# Patient Record
Sex: Female | Born: 1935 | Race: White | Hispanic: No | Marital: Married | Smoking: Never smoker
Health system: Southern US, Community
[De-identification: ages and names within clinical notes are randomized; demographics above are authoritative.]

## PROBLEM LIST (undated history)

## (undated) DIAGNOSIS — F29 Unspecified psychosis not due to a substance or known physiological condition: Secondary | ICD-10-CM

## (undated) DIAGNOSIS — E785 Hyperlipidemia, unspecified: Secondary | ICD-10-CM

## (undated) DIAGNOSIS — F329 Major depressive disorder, single episode, unspecified: Secondary | ICD-10-CM

## (undated) DIAGNOSIS — F419 Anxiety disorder, unspecified: Secondary | ICD-10-CM

## (undated) DIAGNOSIS — E559 Vitamin D deficiency, unspecified: Secondary | ICD-10-CM

## (undated) DIAGNOSIS — E538 Deficiency of other specified B group vitamins: Secondary | ICD-10-CM

## (undated) DIAGNOSIS — I2699 Other pulmonary embolism without acute cor pulmonale: Secondary | ICD-10-CM

## (undated) DIAGNOSIS — I10 Essential (primary) hypertension: Secondary | ICD-10-CM

## (undated) DIAGNOSIS — F0390 Unspecified dementia without behavioral disturbance: Secondary | ICD-10-CM

## (undated) DIAGNOSIS — F32A Depression, unspecified: Secondary | ICD-10-CM

## (undated) DIAGNOSIS — N390 Urinary tract infection, site not specified: Secondary | ICD-10-CM

## (undated) DIAGNOSIS — H409 Unspecified glaucoma: Secondary | ICD-10-CM

## (undated) DIAGNOSIS — H02103 Unspecified ectropion of right eye, unspecified eyelid: Secondary | ICD-10-CM

---

## 1998-11-01 ENCOUNTER — Other Ambulatory Visit: Admission: RE | Admit: 1998-11-01 | Discharge: 1998-11-01 | Payer: Self-pay | Admitting: *Deleted

## 2000-05-24 ENCOUNTER — Other Ambulatory Visit: Admission: RE | Admit: 2000-05-24 | Discharge: 2000-05-24 | Payer: Self-pay | Admitting: *Deleted

## 2001-04-30 ENCOUNTER — Encounter: Admission: RE | Admit: 2001-04-30 | Discharge: 2001-04-30 | Payer: Self-pay | Admitting: *Deleted

## 2001-05-08 ENCOUNTER — Encounter: Admission: RE | Admit: 2001-05-08 | Discharge: 2001-05-08 | Payer: Self-pay | Admitting: Internal Medicine

## 2001-05-08 ENCOUNTER — Encounter: Payer: Self-pay | Admitting: Internal Medicine

## 2001-08-11 ENCOUNTER — Encounter: Admission: RE | Admit: 2001-08-11 | Discharge: 2001-08-11 | Payer: Self-pay | Admitting: Internal Medicine

## 2001-08-11 ENCOUNTER — Encounter: Payer: Self-pay | Admitting: Internal Medicine

## 2002-06-02 ENCOUNTER — Encounter: Admission: RE | Admit: 2002-06-02 | Discharge: 2002-06-02 | Payer: Self-pay | Admitting: Obstetrics & Gynecology

## 2002-06-02 ENCOUNTER — Encounter: Payer: Self-pay | Admitting: Obstetrics & Gynecology

## 2002-08-26 ENCOUNTER — Emergency Department (HOSPITAL_COMMUNITY): Admission: EM | Admit: 2002-08-26 | Discharge: 2002-08-27 | Payer: Self-pay | Admitting: Emergency Medicine

## 2002-09-01 ENCOUNTER — Emergency Department (HOSPITAL_COMMUNITY): Admission: EM | Admit: 2002-09-01 | Discharge: 2002-09-01 | Payer: Self-pay | Admitting: Emergency Medicine

## 2002-09-07 ENCOUNTER — Emergency Department (HOSPITAL_COMMUNITY): Admission: EM | Admit: 2002-09-07 | Discharge: 2002-09-08 | Payer: Self-pay | Admitting: *Deleted

## 2002-09-13 ENCOUNTER — Inpatient Hospital Stay (HOSPITAL_COMMUNITY): Admission: AD | Admit: 2002-09-13 | Discharge: 2002-09-18 | Payer: Self-pay | Admitting: *Deleted

## 2002-10-16 ENCOUNTER — Inpatient Hospital Stay (HOSPITAL_COMMUNITY): Admission: EM | Admit: 2002-10-16 | Discharge: 2002-10-22 | Payer: Self-pay | Admitting: Psychiatry

## 2003-12-06 ENCOUNTER — Other Ambulatory Visit: Admission: RE | Admit: 2003-12-06 | Discharge: 2003-12-06 | Payer: Self-pay | Admitting: *Deleted

## 2006-05-09 ENCOUNTER — Ambulatory Visit (HOSPITAL_COMMUNITY): Payer: Self-pay | Admitting: *Deleted

## 2006-06-09 ENCOUNTER — Inpatient Hospital Stay (HOSPITAL_COMMUNITY): Admission: AD | Admit: 2006-06-09 | Discharge: 2006-06-14 | Payer: Self-pay | Admitting: Psychiatry

## 2006-06-10 ENCOUNTER — Ambulatory Visit: Payer: Self-pay | Admitting: Psychiatry

## 2006-07-04 ENCOUNTER — Ambulatory Visit (HOSPITAL_COMMUNITY): Payer: Self-pay | Admitting: *Deleted

## 2006-08-13 ENCOUNTER — Ambulatory Visit (HOSPITAL_COMMUNITY): Payer: Self-pay | Admitting: *Deleted

## 2006-11-12 ENCOUNTER — Ambulatory Visit (HOSPITAL_COMMUNITY): Payer: Self-pay | Admitting: *Deleted

## 2006-12-24 ENCOUNTER — Ambulatory Visit (HOSPITAL_COMMUNITY): Payer: Self-pay | Admitting: *Deleted

## 2006-12-29 ENCOUNTER — Ambulatory Visit: Payer: Self-pay | Admitting: *Deleted

## 2006-12-29 ENCOUNTER — Inpatient Hospital Stay (HOSPITAL_COMMUNITY): Admission: AD | Admit: 2006-12-29 | Discharge: 2007-01-05 | Payer: Self-pay | Admitting: *Deleted

## 2007-01-16 ENCOUNTER — Ambulatory Visit (HOSPITAL_COMMUNITY): Payer: Self-pay | Admitting: *Deleted

## 2007-01-20 ENCOUNTER — Inpatient Hospital Stay (HOSPITAL_COMMUNITY): Admission: AD | Admit: 2007-01-20 | Discharge: 2007-01-26 | Payer: Self-pay | Admitting: *Deleted

## 2007-02-04 ENCOUNTER — Ambulatory Visit (HOSPITAL_COMMUNITY): Payer: Self-pay | Admitting: *Deleted

## 2007-02-06 ENCOUNTER — Encounter: Admission: RE | Admit: 2007-02-06 | Discharge: 2007-02-06 | Payer: Self-pay | Admitting: Internal Medicine

## 2007-02-10 ENCOUNTER — Emergency Department (HOSPITAL_COMMUNITY): Admission: EM | Admit: 2007-02-10 | Discharge: 2007-02-10 | Payer: Self-pay | Admitting: Emergency Medicine

## 2007-02-12 ENCOUNTER — Encounter: Admission: RE | Admit: 2007-02-12 | Discharge: 2007-02-12 | Payer: Self-pay | Admitting: Internal Medicine

## 2007-02-13 ENCOUNTER — Ambulatory Visit: Payer: Self-pay | Admitting: Psychiatry

## 2007-02-13 ENCOUNTER — Inpatient Hospital Stay (HOSPITAL_COMMUNITY): Admission: RE | Admit: 2007-02-13 | Discharge: 2007-02-26 | Payer: Self-pay | Admitting: *Deleted

## 2007-02-26 ENCOUNTER — Ambulatory Visit (HOSPITAL_COMMUNITY): Admission: RE | Admit: 2007-02-26 | Discharge: 2007-02-26 | Payer: Self-pay | Admitting: Endocrinology

## 2007-03-04 ENCOUNTER — Inpatient Hospital Stay (HOSPITAL_COMMUNITY): Admission: RE | Admit: 2007-03-04 | Discharge: 2007-03-24 | Payer: Self-pay | Admitting: *Deleted

## 2007-03-04 ENCOUNTER — Ambulatory Visit: Payer: Self-pay | Admitting: *Deleted

## 2007-03-25 ENCOUNTER — Other Ambulatory Visit (HOSPITAL_COMMUNITY): Admission: RE | Admit: 2007-03-25 | Discharge: 2007-04-01 | Payer: Self-pay | Admitting: Psychiatry

## 2007-04-01 ENCOUNTER — Ambulatory Visit (HOSPITAL_COMMUNITY): Payer: Self-pay | Admitting: *Deleted

## 2007-04-10 ENCOUNTER — Ambulatory Visit (HOSPITAL_COMMUNITY): Payer: Self-pay | Admitting: *Deleted

## 2007-04-15 ENCOUNTER — Ambulatory Visit (HOSPITAL_COMMUNITY): Payer: Self-pay | Admitting: *Deleted

## 2007-04-22 ENCOUNTER — Ambulatory Visit (HOSPITAL_COMMUNITY): Payer: Self-pay | Admitting: *Deleted

## 2007-04-22 ENCOUNTER — Ambulatory Visit (HOSPITAL_COMMUNITY): Admission: RE | Admit: 2007-04-22 | Discharge: 2007-04-22 | Payer: Self-pay | Admitting: Internal Medicine

## 2007-05-15 ENCOUNTER — Encounter: Admission: RE | Admit: 2007-05-15 | Discharge: 2007-05-15 | Payer: Self-pay | Admitting: Internal Medicine

## 2007-05-17 ENCOUNTER — Ambulatory Visit: Payer: Self-pay | Admitting: *Deleted

## 2007-05-17 ENCOUNTER — Inpatient Hospital Stay (HOSPITAL_COMMUNITY): Admission: AD | Admit: 2007-05-17 | Discharge: 2007-06-02 | Payer: Self-pay | Admitting: *Deleted

## 2007-05-26 ENCOUNTER — Encounter: Payer: Self-pay | Admitting: *Deleted

## 2007-09-17 ENCOUNTER — Ambulatory Visit (HOSPITAL_COMMUNITY): Admission: RE | Admit: 2007-09-17 | Discharge: 2007-09-17 | Payer: Self-pay | Admitting: Urology

## 2008-01-26 ENCOUNTER — Ambulatory Visit (HOSPITAL_COMMUNITY): Admission: RE | Admit: 2008-01-26 | Discharge: 2008-01-26 | Payer: Self-pay | Admitting: Urology

## 2008-01-26 ENCOUNTER — Encounter (INDEPENDENT_AMBULATORY_CARE_PROVIDER_SITE_OTHER): Payer: Self-pay | Admitting: Interventional Radiology

## 2009-01-05 ENCOUNTER — Encounter: Admission: RE | Admit: 2009-01-05 | Discharge: 2009-01-05 | Payer: Self-pay | Admitting: Orthopedic Surgery

## 2009-06-22 ENCOUNTER — Encounter: Admission: RE | Admit: 2009-06-22 | Discharge: 2009-06-22 | Payer: Self-pay | Admitting: Internal Medicine

## 2011-04-17 NOTE — Discharge Summary (Signed)
[name], TOBACK NO.:  [id number]   MEDICAL RECORD NO.:  [id number]          PATIENT TYPE:  IPS   LOCATION:  0503                          FACILITY:  [facility]   PHYSICIAN:  Jasmine Pang, M.D. DATE OF BIRTH:  [birth year]   DATE OF ADMISSION:  05/17/2007  DATE OF DISCHARGE:  06/02/2007                               DISCHARGE SUMMARY   IDENTIFYING INFORMATION:  This is a voluntary admission for this 77-year-  old widowed white female who was admitted on May 17, 2007.  She was  most recently here from March 04, 2007 to March 24, 2007.   HISTORY OF PRESENT ILLNESS:  The patient has a history of depression,  and was referred for further inpatient treatment.  She reported feeling  severely depressed for the past week.  She reported daily crying spells,  feeling hopeless, helpless, anxious.  She wishes she would die.  She no  longer wants to live this way.  She is still grieving the death of her  husband who died in 2006/02/20.  Her medications were recently changed  and she feels they are not working.  She reports chronic headache and  nausea.  Again, her most recent stay with [place] was March 04, 2007 to March 24, 2007.  She has had a number of hospitalizations on our unit.  She  had just begun to see a new doctor who was going to take over her  psychiatric medications and medical treatment, Dr. Erick Blinks. She  states there is some history of depression in her family.  She has  glaucoma, gastroesophageal reflux disease and hyperlipidemia.  She is  currently on Prestiq 50 mg p.o. daily, Protonix 40 mg p.o. daily,  Lipitor 20 mg at bedtime, Seroquel 100 mg at h.s., Ativan  0.5 mg  q.a.m., Ativan 1 mg to 2 mg at h.s., Timoptic 1 drop q. day, and  Travatan 1 drop q. day.  She has no known drug allergies.   PHYSICAL FINDINGS:  The patient had no remarkable physical findings.  She walks unaided.  She appears to be her stated age of 77.   ADMISSION LABORATORIES:  Urine  drug screen was negative.  Urinalysis was  within normal limits.  Hemogram was within normal limits.  Basic  metabolic panel was grossly within normal limits except for an elevated  glucose of 193 (70-99).  A hepatic profile was within normal limits, and  TSH was 3.534, which was within normal limits.   HOSPITAL COURSE:  Upon admission, the patient was started on Timoptic  eye drops 1 drop each eye daily, Travatan eye drops 1 drop each eye  daily, Prestiq 50 mg p.o. q. day, Protonix 40 mg daily, Lipitor 20 mg  p.o. nightly, Seroquel 100 mg p.o. nightly, Ativan 0.5 mg 1 p.o. q. a.m.  and 1 mg p.o. nightly.  She was also started on Ativan 0.5 mg p.o.  t.i.d. p.r.n. anxiety.  On May 18, 2007, due to complaints of GERD, she  was started on Carafate 1 gram per 10 mL water in the a.m. before  breakfast and at h.s.  She was also started on Phenergan 25 mg p.o. now  secondary to nausea.  On May 19, 2007, Seroquel was discontinued.  She  was started on Zoloft 12.5 mg p.o. daily, Remeron 7.5 mg nightly.  Protonix was discontinued.  On May 20, 2007, Seroquel was restarted at  100 mg p.o. nightly because she was unable to get to sleep.  On May 20, 2007, she was started on Zofran 4 mg p.o. now x1 dose.  On May 22, 2007, her Zoloft was increased to 25 mg daily.  On May 23, 2007, due to  a.m. sedation, her Seroquel was discontinued at h.s.  Instead, she was  begun on Seroquel 50 mg p.o. nightly, may repeat x1 p.r.n. insomnia.  She was also started on methylphenidate 2.5 mg p.o. q.a.m. and noon.  On  May 26, 2007, she was started on Zantac 150 mg now, then 150 mg  p.o.  q. day.  On May 26, 2007, due to difficulty sleeping, the patient's  Seroquel was increased to 100 mg p.o. nightly.  Remeron was increased to  15 mg p.o. nightly.  On May 28, 2007, Zoloft was increased to 50 mg  p.o. nightly.  The patient tolerated her medications well.  She  continued to complain of some nausea but overall was  able to take these  medications.   Initially, the patient was feeling depressed and anxious.  She was  complaining of nausea.  Her sleep was good.  Appetite, some decrease.  She has some passive suicidal ideation, I'm tired of being sick.  We  discussed assisted living since she has not done well when she has been  discharged back to her home.  She was tearful about this and somewhat  resistant, but agreed to pursue this if this is what we and her family  felt was best.  I talked with her new doctor, Erick Blinks, MD, who  just took over her care.  She made recommendations for medication  management.  This included beginning the Zoloft as above and Remeron as  above.  She recommended using an H2-blocker instead of a proton pump  inhibitor, so Protonix was discontinued and Zantac was started.  She  continued to feel depressed and anxious during the hospital stay.  She  complained of low energy.  She had begun to look at assisted-living  places, and still remained anxious about this.  She was appropriate on  the unit and able to participate in the unit therapeutic groups and  activities and therapies.  Her son was actively involved in helping find  an assisted-living for her.  He found [facility], and after  visiting it he felt this would be a good placement for her.  As the  patient's hospitalization progressed, she became less depressed, more  positive mood and affect.  She had no suicidal or homicidal ideation.  No auditory or visual hallucinations.  She continued to be anxious about  going to [facility].  She had days periodically where she felt  more nauseated and depressed, but overall she was less depressed, and  her sleep and appetite were good.  She remained somewhat tearful about  the transition to assisted living instead of going back to her own home.  The patient continued to progress and do well.  She was placed on the medications as indicated in the above  paragraph.  On June 02, 2007, the  patient's mental status had improved markedly from admission status.  She was friendly and cooperative with good eye contact.  Speech was  normal rate and flow.  Psychomotor activity was within normal limits.  Her mood was somewhat anxious but overall euthymic.  Affect wide range.  No suicidal or homicidal ideation.  No thoughts of self-injurious  behavior.  No auditory or visual hallucinations.  No paranoia or  delusions.  Thoughts were logical and goal-directed.  Thought content.  No predominant theme.  Cognitive was grossly back to baseline which was  within normal limits.  She was felt safe to be transitioned to assisted-  living [facility]) today.  Her son was going to pick her up and  take her.   DISCHARGE DIAGNOSES:  AXIS I:  Major depressive disorder, recurrent, severe without psychosis.  Anxiety disorder not otherwise specified.  (Some symptoms of panic  disorder).  AXIS II:  None.  AXIS III:  Gastroesophageal reflux disease, osteoporosis,  hyperlipidemia, glaucoma.  AXIS IV:  Moderate (grief, problems with primary support group, burden  of psychiatric illness).  AXIS V: Global assessment of functioning on discharge was 50.  Global  assessment of functioning upon admission was 35.  Global assessment of  functioning highest the past year was 65.   DISCHARGE PLANS:  There were no specific activity level or dietary  restrictions.   POST HOSPITAL CARE PLANS:  The patient will see Erick Blinks, MD for  followup medication management.  This appointment is being arranged by  our case manager.   DISCHARGE MEDICATIONS:  1. Travatan 1 drop in each eye daily.  2. Lipitor 20 mg daily.  3. Ativan 1 mg 1/2 pill in the morning and 1 pill at bedtime.  4. Prestiq 50 mg daily.  5. Timoptic eye drops 1 drop in each eye daily.  6. Carafate 1 gram at 10:00 a.m. and 8:00 p.m.  7. Methylphenidate 2.5 mg p.o. q.a.m. and noon.  8. Zantac 150 mg in  the a.m.  9. Seroquel 100 mg at bedtime.  10.Remeron 15 mg at bedtime.  11.Zoloft 50 mg in the a.m.      Jasmine Pang, M.D.  Electronically Signed     BHS/MEDQ  D:  06/02/2007  T:  06/02/2007  Job:  [id number]

## 2011-04-17 NOTE — H&P (Signed)
[name], Pennington NO.:  [id number]   MEDICAL RECORD NO.:  [id number]          PATIENT TYPE:  IPS   LOCATION:  0503                          FACILITY:  [facility]   PHYSICIAN:  Jasmine Pang, M.D. DATE OF BIRTH:  12-Dec-1926   DATE OF ADMISSION:  05/17/2007  DATE OF DISCHARGE:                       PSYCHIATRIC ADMISSION ASSESSMENT   IDENTIFYING INFORMATION:  This is a voluntary admission to the services  of Dr. Milford Cage.  This is a 77 year old widowed white female.  She  was most recently here March 04, 2007 to March 24, 2007.   The patient has a history for depression and was referred for further  inpatient treatment by her psychiatrist, Dr. Milford Cage.  She reports  feeling severely depressed for the past week.  She reports daily crying  spells, feeling hopeless, helpless, anxious, she wishes she would die  and no longer wants to live this way.  She is still grieving the death  of her husband who died in 02-23-06.  Her medications were recently  changed and she feels that they are not working.  She reports chronic  headache and nausea.  Again, her most recent stay with [place] was March 04, 2007 to March 24, 2007.  She has had several visits with [place].   SOCIAL HISTORY:  She lives alone.  She has two supportive children but  she does live alone and she lost her husband after 2 years of marriage.   FAMILY HISTORY:  She denies.   ALCOHOL/DRUG HISTORY:  She denies.   PRIMARY CARE PHYSICIAN:  Her primary care Laquon Emel is Dr. Lendell Caprice and  Dr. Selena Batten at [phone].   MEDICAL PROBLEMS:  Glaucoma, gastroesophageal reflux disease,  hyperlipidemia.   MEDICATIONS:  She is currently prescribed Pristiq 50 mg p.o. q.d.,  Protonix 40 mg p.o. q.d., Lipitor 20 mg at h.s., Seroquel 100 mg at  h.s., Ativan 0.5 mg q.a.m., Ativan 1-2 mg at h.s., Timoptic 1 drop q.d.  and Travatan 1 drop q.d.   ALLERGIES:  No known drug allergies.   POSITIVE PHYSICAL FINDINGS:  Her  admitting labs are pending.  She has no  remarkable physical findings.  She appears to be her stated age of 36.  She walks unaided.  Her vital signs on admission show she is 5 feet 2  inches, weighs 142 pounds, temperature is 97.7, blood pressure 137/75 to  138/76, pulse 80-83, respirations are 20.   MENTAL STATUS EXAM:  Today, she is alert and oriented x3.  She appears  to be appropriately groomed, dressed and nourished.  She is walking  unaided.  Her motor is a little slow but this is her usual presentation.  Her speech is not pressured.  Her mood is depressed and anxious but not  to the degree that she has been in the past.  Thought processes are  fair.  She is not frankly delusional or paranoid.  Her judgment and  insight are fair.  Concentration and memory are fair.  Intelligence is  at least average.  She denies being actively suicidal or homicidal.  She  denies auditory or visual hallucinations.  DIAGNOSES:  AXIS I:  Major depressive disorder, recurrent, severe.  Anxiety disorder not otherwise specified.  AXIS II:  Deferred.  AXIS III:  Gastroesophageal reflux disease, osteoporosis,  hyperlipidemia.  AXIS IV:  Moderate (grief, problems with primary support group).  AXIS V:  35; in the past her highest GAF has been 65.   PLAN:  To admit for safety and stabilization.  Dr. Katrinka Blazing apparently has  a plan to adjust her medications.  The patient is requesting something  so that she does not wake up with a headache.  Ativan 0.5 mg t.i.d.  p.r.n. has already been ordered and she also complains about nausea and  to address that we can add some Carafate.  We will add some Carafate at  night and also in the morning.  She stated that she was supposed to be  getting a phone call in the morning to schedule and endoscopy.  The  other thing is that we might ought to consider placement in assisted-  living or some place where she would have more social support.      Mickie Leonarda Salon,  P.A.-C.      Jasmine Pang, M.D.  Electronically Signed    MD/MEDQ  D:  05/18/2007  T:  05/18/2007  Job:  [id number]

## 2011-04-20 NOTE — Discharge Summary (Signed)
[name], [name] NO.:  [id number]   MEDICAL RECORD NO.:  [id number]          PATIENT TYPE:  IPS   LOCATION:  0301                          FACILITY:  [facility]   PHYSICIAN:  Jasmine Pang, M.D. DATE OF BIRTH:  1927-03-30   DATE OF ADMISSION:  03/04/2007  DATE OF DISCHARGE:  03/24/2007                               DISCHARGE SUMMARY   IDENTIFICATION:  [age]-year-old Caucasian female who was  admitted on a voluntary basis.   HISTORY OF PRESENT ILLNESS:  The patient presents to [facility] complaining of nausea and pressure headache when she  wakes up.  She states that it started 2 weeks ago after being discharged  from here.  The symptoms last all morning almost every day.  She states  that she wonders if it may be from her medications.  She says she is  tired of being tired.  Husband died 1 year ago on 02-21-06.  She  denied auditory or visual hallucinations, or suicidal ideation, but does  make statements about wanting to be with her husband because she feels  so bad.  The patient was seen here 2 weeks ago for major depressive  disorder.  She is an outpatient of Dr. Milford Cage.  She has been here  for a history for depression.  Every Monday she sees Jamelle Haring,  psychologist, for therapy.  Her daughter has a history of depression.  The patient has no alcohol or drug abuse history.  The patient has  hypercholesterolemia.  She is on numerous medications (see history of  present illness).  She has no known drug allergies.   PHYSICAL FINDINGS:  The patient had no acute physical on medical  problems.  She was in no distress.   ADMISSION LABORATORIES:  Urinalysis was negative. Urinalysis was within  normal limits except for 7 to 10 WBCs and a few bacteria.  Comprehensive  metabolic panel was grossly within normal limits except for an elevated  glucose of 124 (70 to 99) an elevated ALT of 40 (0 to 35), and an  elevated  bilirubin of 1.3 (0.3-1.2).  CBC was grossly within normal  limits.  TSH was within normal limits at 3.740.   HOSPITAL COURSE:  Upon admission, patient was continued on her home  medications of Seroquel 100 mg p.o. nightly, Lexapro 10 mg p.o. q. day,  Klonopin 0.5 mg p.o. t.i.d., Protonix 40 mg b.i.d., Zocor 40 mg daily,  Timoptic eye drops both eyes at h.s., Xalatan eye drops 1 drop daily  both eyes, calcium tablets 500 mg p.o. b.i.d., Fosamax Plus D 70 mg p.o.  q.7 days on Wednesday.  On March 04, 2007, patient was given Ativan 1 mg  p.o. q.6 hours p.r.n. anxiety, and trazodone 550 mg p.o. nightly p.r.n.  insomnia..  On March 07, 2007, patient's Lexapro was increased to 20 mg  p.o. q. day.  Lamictal 25 mg p.o. nightly was started.  On March 12, 2007  due to daytime sedation, Seroquel was decreased to 50 mg p.o. nightly.  On March 14, 2007,  Lamictal was increased to 50 mg p.o.  q. day,  Seroquel was increased back to 100 mg p.o. nightly due to insomnia.  On  March 21, 2007, Protonix was changed from b.i.d. to 40 mg p.o. q.a.m.,  Lamictal was increased to 75 mg p.o. q. day.  The patient tolerated her  medications well with no significant side effects except for some  sedation.   During the first week here, the patient was very depressed and anxious.  She would repeatedly state I want to be happy.  She stated she wished  she could be with her husband who was deceased.  She was very tearful.  She had difficulty sleeping.  Patient talked a lot about her deceased  husband and how much she missed him.  Lexapro was increased to 20 mg  q.a.m., clonazepam 0.5 mg p.o. t.i.d. was started, Lamictal 25 mg p.o.  nightly, and then Seroquel 500 mg p.o. nightly was started.  On March 13, 2007, the patient stated she felt somewhat better.  She was still  depressed and anxious with constricted affect.  She continued to state  I want to feel good, and I feel like I'm not going to be okay again.  Lamictal  was increased to 50 mg p.o. daily on March 14, 2007.  Seroquel  was increased back to 100 mg p.o. nightly because of insomnia when it  was decreased to 50 mg p.o. nightly.  The patient was also getting  lorazepam 1 mg p.o. q.h.s. and was doing well on this in addition to the  Seroquel.  On March 15, 2007, patient's mood continued to be depressed  and anxious and hopeless.  She stated I want to be with Michelle Piper (her  deceased husband).  She did admit that her children are a deterrent to  suicide.  The second week in the hospital patient continued to be  depressed and anxious, though somewhat less so than upon admission.  Her  family had found a caretaker who was going to help her by staying with  her every day in the morning.  We were also trying to get her into the  intensive outpatient program for therapy for continued intensive  therapy.  In addition, we were looking at [facility]  for help when she is at home in the afternoons.   On March 21, 2007, patient had gone outside and felt less depressed and  anxious.  She enjoyed the pretty weather.  She was anxious about her  upcoming discharge, but her stomach had been feeling good.  The Lamictal  was increased to 75 mg p.o. daily.  On March 22, 2007, sleep was good,  appetite was good.  Mood was improved.  She was less depressed and  anxious.  There was no suicidal or homicidal ideation.  No auditory or  visual hallucinations.  The patient continued to improve.  She reported  she liked the combination of the lorazepam at bedtime in addition to the  Seroquel.  She felt she got a good night's sleep with this combination.  I will continue this despite the fact that she is on clonazepam during  the daytime.  The patient's, on March 24, 2007, mental status had  improved markedly from admission status.  The patient was friendly,  cooperative, conversant.  She had good eye contact.  Speech was normal rate and flow.  Psychomotor  activity was within normal limits.  Mood was  somewhat anxious about going home but overall euthymic.  Affect wide  range.  There  was no suicidal or homicidal ideation.  No thoughts of  self-injurious behavior.  No auditory or visual hallucinations.  No  paranoia or delusions.  Thoughts were logical and goal-directed.  Thought content anxious about returning home and hoping she will be able  to keep from getting so anxious again.   DISCHARGE DIAGNOSES:  AXIS I:  Major depression, major depressive  disorder, recurrent, severe without psychosis.  Anxiety disorder not  otherwise specified.  AXIS II:  None.  AXIS III:  Hyperlipidemia, osteoporosis, gastroesophageal reflux  disease.  AXIS IV:  Moderate (grief over loss of husband who died 1 year ago).  Issues related to her psychiatric problems.  AXIS IV: Global assessment of functioning upon discharge was 50.  Global  assessment of functioning upon admission 35.  Global assessment of  functioning highest past year was 65.   DISCHARGE PLANS:  There were no specific activity level or dietary  restrictions.   DISCHARGE MEDICATIONS:  1. Clonazepam 0.5 mg p.o. t.i.d.  2. Zocor 40 mg p.o. q 6 p.m.  3. Timoptic 0.5% ophthalmic solution, 1 drop each eye at bedtime  4. Calcium carbonate 500 mg p.o. b.i.d.  5. Fosamax Plus D once weekly on Wednesday.  6. Xalatan ophthalmic solution 1 drop in both eyes daily.  7. Lexapro 20 mg daily.  8. Seroquel 100 mg at bedtime.  9. Protonix 40 mg each a.m. with food.  10.Lamictal 25 mg 3 pills at bedtime.  11.Lorazepam 1 mg at bedtime.      Jasmine Pang, M.D.  Electronically Signed     BHS/MEDQ  D:  03/24/2007  T:  03/24/2007  Job:  [id number]

## 2011-04-20 NOTE — H&P (Signed)
NAMEWESTON, FULCO NO.:  [id number]   MEDICAL RECORD NO.:  [id number]          PATIENT TYPE:  IPS   LOCATION:  0507                          FACILITY:  [facility]   PHYSICIAN:  Jasmine Pang, M.D. DATE OF BIRTH:  03-21-1927   DATE OF ADMISSION:  01/20/2007  DATE OF DISCHARGE:                       PSYCHIATRIC ADMISSION ASSESSMENT   A 77 year old widowed white female voluntarily admitted on January 20, 2007.  The patient presents with a history of depression, suicidal  thoughts, with a plan to overdose.  The patient is grieving over her  husband's death; it is almost a 1-year anniversary after a marriage of  53 years.  The patient was recently discharged from our facility for  similar symptoms.  She is having problems with nausea that she  attributes to her bedtime medication and she states that is currently  her biggest problem.   PAST PSYCHIATRIC HISTORY:  Third admission to the [facility], was recently discharged on January 05, 2007.  Sees Dr. Milford Cage for outpatient mental health services.   SOCIAL HISTORY:  A 77 year old widowed white female.  Her husband died  approximately a year ago after being married for 53 years.  She has two  supportive children.  The patient lives alone.   FAMILY HISTORY:  Denies.   ALCOHOL/DRUG HISTORY:  Nonsmoker.  Denies any alcohol or drug use.  Primary care Daylyn Azbill is Dr. Lendell Caprice at phone number [phone].   MEDICAL PROBLEMS:  Glaucoma.   MEDICATIONS:  1. Ativan 0.5-1 mg p.o. daily and Ativan 1-2 mg p.r.n. at bedtime.  2. Lipitor 20 mg at bedtime.  3. Takes a woman's multivitamin with calcium.  4. Celexa 10 mg b.i.d.  5. Seroquel 100 mg at bedtime.  6. Calcium with vitamin D 100 mg two p.o. daily.  7. Timolol eye drops 0.5% one drop each eye daily.  8. Xalatan 0.005% one drop daily each eye.  9. Fosamax + D once a week on Wednesday.   DRUG ALLERGIES:  No known allergies.   REVIEW OF SYSTEMS:   The patient denies any fever or chills.  No chest  pain.  No shortness breath.  Positive for nausea.  No vomiting.  No  diarrhea.  No falls.  No seizures.  No muscle aches.  Positive for  depression.  No substance abuse.   PHYSICAL EXAMINATION:  VITAL SIGNS:  Temperature is 97.9.  Heart rate  82.  Respirations 60.  Blood pressure 145/82.  Height 5 feet 1 inches  tall.  Weight 138 pounds.  GENERAL APPEARANCE:  An alert, elderly female in no acute distress.  She  appears well-nourished.  HEART:  Regular rate and rhythm.  CHEST EXAM:  Chest is clear, no wheezing.  ABDOMEN:  Soft, nontender.  EXTREMITIES:  Moves all extremities.  No clubbing.  No edema.  SKIN:  Warm and dry without rashes or lacerations.  NEUROLOGIC:  Findings are intact, nonfocal.  No tremors.  No tics.   LABORATORY DATA:  No current lab work available.  The patient again was  recently discharged approximately 1 week ago.   MENTAL STATUS EXAM:  She  is fully alert, cooperative, good eye contact.  She is casually dressed.  Her speech is clear, normal pace and tone.  The patient's mood is depressed.  The patient is sad, tearful, but  pleasant.  Thought processes are coherent; there is no evidence of  psychosis, cognitive function intact.  Memory is good.  Judgment is  good.  Insight is good.   Axis I:  Major depressive disorder recurrent, severe.  Axis II:  Deferred.  Axis III:  Glaucoma.  Axis IV:  Other psychosocial problems related to husband's death.  Axis V:  Current is 35.   PLAN:  Voluntary admission for depression and suicidal thoughts.  Contract for safety.  Stabilize her mood and thinking.  We will resume  her medications.  We will discontinue Seroquel at this time and give a  trial run of Risperdal for ruminating and for sleep.  We will contact  her primary care Brinley Treanor for recommendations in regards to the  patient's nausea.  The patient is to increase coping skills.  Continue  to attend groups as she  has.  Her tentative length of stay is 5-7 days.      Landry Corporal, N.P.      Jasmine Pang, M.D.  Electronically Signed    JO/MEDQ  D:  01/24/2007  T:  01/25/2007  Job:  [id number]

## 2011-04-20 NOTE — Discharge Summary (Signed)
NAMEZINIA, INNOCENT NO.:  [id number]   MEDICAL RECORD NO.:  [id number]          PATIENT TYPE:  IPS   LOCATION:  0506                          FACILITY:  [facility]   PHYSICIAN:  Jasmine Pang, M.D. DATE OF BIRTH:  1926-12-23   DATE OF ADMISSION:  06/09/2006  DATE OF DISCHARGE:  06/14/2006                                 DISCHARGE SUMMARY   IDENTIFYING INFORMATION:  The patient is a 77 year old widowed Caucasian  female who was admitted on a voluntary basis to my service on June 09, 2006.  I also see her outpatient in the outpatient clinic.   HISTORY OF PRESENT ILLNESS:  The patient has had a history of depression.  Recently has begun to have auditory hallucinations.  She lost her husband of  56 years three months ago.  She lost her sister as well and brother-in-law  this year.  She heard her husband's voice at night.  She was doing well  until she developed a UTI and back pain.  She has passive suicidal ideation.  She continued to feel that she may rather be with her husband.   PAST PSYCHIATRIC HISTORY:  This is the second [facility] admission.  She was here  in 2003.  As indicated above, she sees me in the outpatient clinic for  medication management.  She does not have a therapist at this time.   FAMILY HISTORY:  The patient denies.   SUBSTANCE ABUSE HISTORY:  Nonsmoker.  Does not use alcohol or drugs.   PAST MEDICAL HISTORY:  Medical problems include osteoporosis, dyslipidemia  and history of ovarian cancer in 1999.   MEDICATIONS:  Calcium 600 mg p.o. t.i.d., Women's One-A-Day pill p.o. daily,  timolol GFS 1 drop in each eye daily, Xalatan 0.005% 1 drop in each eye  every a.m., Seroquel 50 mg p.o. q.h.s., lorazepam 0.25 mg to 0.5 mg daily  q.h.s., Celexa 10 mg q.d., Lipitor 20 mg q.h.s., Phenergan 6.25 mg to 12.5  mg p.o. p.r.n. nausea, Fosamax D 4 tablets a.m. every week.   ALLERGIES:  No known drug allergies.   PHYSICAL EXAMINATION:  A complete physical  examination was done by our nurse  practitioner.  She was found to be healthy and in no acute distress.  There  were no significant physical abnormalities at this time.   LABORATORY DATA:  On admission, CBC was within normal limits.  WBC 7.1,  hemoglobin 13.8, hematocrit 40.7.  Routine chemistry panel was within normal  limits except for an elevated glucose of 117 (70-99).  Sodium was 140,  potassium 3.7, chloride 106, CO2 30, BUN 19, creatinine 1, calcium 9.7,  albumin 4.1, total protein 7.3.  Hepatic panel was all within normal limits.  AST 17, ALT 17, ALP 65, total bilirubin 1.1.  Magnesium is 2.2.  Urine drug  screen was negative.  Urinalysis was within normal limits.  TSH was 2.699,  which was within normal limits.   HOSPITAL COURSE:  Upon admission, the patient was kept on her One-A-Day  vitamin 1 p.o. daily, timolol GFS 1 drop in each eye h.s. daily, Xalatan  0.005% 1 drop in  each eye in the a.m., Lipitor 20 mg p.o. q.h.s., Phenergan  6.25 mg to 12.5 mg p.o. p.r.n.  She was also placed on Ativan 0.5 mg, 1/2-1  tab p.o. b.i.d. p.r.n. anxiety and Ativan as a standing order 0.5 mg p.o.  q.h.s.  She was also continued on her calcium vitamin D 500 mg, 1 tab b.i.d.  She was placed on Seroquel 50 mg p.o. q.h.s., may repeat dose if needed for  sleep.  She was placed on Fosamax D 4 tablets daily in the a.m. once a week.  On June 09, 2006, the patient was placed on Celexa 10 mg p.o. b.i.d.  Also  her Lipitor 20 mg p.o. was scheduled at 1800.  On June 09, 2006, the patient  required an Ativan 0.5 mg p.o. now.  On June 10, 2006, the patient was begun  on Ativan 0.25 mg to 0.5 mg b.i.d. p.r.n.  The other Ativan order was  discontinued.  The Ativan 0.5 mg p.o. q.h.s. was continued.  On June 10, 2006, the Ativan was increased to 1 mg p.o. q.h.s.  On June 10, 2006, she was  also placed on Zyprexa 2.5 mg p.o. q.h.s.  The patient tolerated her  medications well with no significant side effects.   When  I first met with the patient, she discussed her anxiety and depression.  She stated they had worsened significantly.  She is decompensated.  She had  begun to experience auditory hallucinations and had passive suicidal  ideation.  On June 11, 2006, she discussed her multiple losses in the past  year.  Husband had died, two sisters and a brother-in-law passed away.  She  described her husband's illness and how she tried to be strong.  She took  care of him and got him to all the appointments necessary and was able to  give him his medications and keep him at home.  After he died, she states  there was much support from her children while she was grieving.  On June 12, 2006, the patient stated there was less pressure in her head.  She felt  more rested on the Zyprexa, was very sedate.  The nausea was resolving.  The  auditory hallucinations had resolved.  She was still tearful when talking  about her husband.   On June 12, 2006, our counselor met with the patient, the patient's son and  daughter-in-law.  The patient stated that she is feeling much better today,  that she is not having any pain.  She denied suicidal or homicidal ideation.  She stated that she is still grieving the death of her husband.  The  patient's son and daughter-in-law encouraged her to join a support group  through hospice.  The patient's son stated that the patient has a strong  support system of family and friends.  The patient stated that she had a  wonderful family and she also stated that, since her medications have been  regulated, she enjoyed going to the groups in the hospital.  She planned to  continue to take her medications and see me in the outpatient clinic.   On June 13, 2006, the patient was still feeling better.  She was less  anxious and depressed.  She felt good about the family session with her son and daughter-in-law.  She was still tearful about her husband's death.  She  reported some vague  auditory hallucinations (hearing her husband) but these  were not significant.  On  June 14, 2006, the patient's mental status had  improved.  She was friendly, relaxed and conversant.  She had good eye  contact.  Her psychomotor activity was within normal limits.  Speech normal  rate and slow.  Mood was less depressed and anxious.  Her affect was wide  range.  She was able to smile and laugh.  There was no suicidal or homicidal  ideation.  There were no auditory or visual hallucinations.  No paranoia or  delusions.  Thoughts were logical and goal directed.  Thought content  continues to grieve the loss of her husband.  Cognitive exam was grossly  within normal limits.  She was complaining of some upper gastric discomfort  and I decided to try Protonix.   DISCHARGE DIAGNOSES:  AXIS I:  Major depressive disorder, recurrent, severe.  Generalized anxiety disorder.  AXIS II:  None.  AXIS III:  Osteoporosis, dyslipidemia.  AXIS IV:  Moderate (grieving about her husband's death).  AXIS V:  GAF upon discharge 45; GAF upon admission 25; GAF highest past year  85.   ACTIVITY/DIET:  There were no specific activity level or dietary  restrictions.   DISCHARGE MEDICATIONS:  1.  Celexa 20 mg, 1/2 tablet daily.  2.  Zyprexa 2.5 mg at bedtime.  3.  Seroquel 50 mg, 1-2 pills at bedtime as needed.  4.  Ativan 1 mg at bedtime.  5.  Ativan 0.25 mg to 0.5 mg p.o. p.r.n. anxiety during the day.  6.  She was also to resume all of her home medications which are listed in      the past medical history.   POST-HOSPITAL CARE PLANS:  The patient was scheduled to see me on Thursday,  July 04, 2006 at 4:15 p.m.  She is also scheduled to see Dr. Jamelle Haring,  a psychologist, on Thursday, June 20, 2006 at 3 p.m.      Jasmine Pang, M.D.  Electronically Signed     BHS/MEDQ  D:  06/25/2006  T:  06/25/2006  Job:  [id number]

## 2011-04-20 NOTE — Discharge Summary (Signed)
NAMEALEJANDRO, ADCOX NO.:  [id number]   MEDICAL RECORD NO.:  [id number]          PATIENT TYPE:  IPS   LOCATION:  0301                          [facility]ACILITY:  [facility]   PHYSICIAN:  Jasmine Pang, M.D. DATE OF BIRTH:  1927-10-19   DATE OF ADMISSION:  12/23/2006  DATE OF DISCHARGE:  01/05/2007                               DISCHARGE SUMMARY   IDENTIFYING INFORMATION:  [age]-year-old white widowed female  who was seen by me in the outpatient clinic.  She was admitted on a  voluntary basis on December 29, 2006.   HISTORY OF PRESENT ILLNESS:  Patient has a history of depression with  passive suicidal ideation.  She stopped her antipsychotic about 3 weeks  ago because we felt it was causing nausea.  She has been decompensating  since the.  Her sleep has decreased.  Her appetite is okay.  She has  been having difficulty coping with her husband's death of 57 years.  He  died last 27-Feb-2023 and she is dealing with the upcoming 1st anniversary of  his death.  She heard his voice shortly after he died but none now.  This is the third [facility] admission.  She was here in  July 2007.  She sees me in the outpatient clinic.  She sees Dr. Jamelle Haring for therapy.  She denies any drug or alcohol use.  She has  glaucoma.  She is on numerous meds (see admission meds and hospital  course).   DRUG ALLERGIES:  No known drug allergies.   PHYSICAL FINDINGS:  A physical exam was within normal limits.  She was  in no acute distress.  She was alert and oriented x3.   LABORATORY:  CBC was within normal limits.  Bilirubin was elevated at  1.4.  TSH 4.228 which was within normal limits.  Urine drug screen was  negative. Routine chemistry profile was grossly within normal limits  except for an elevated glucose of 107 (70-99), an elevated total  bilirubin of 1.4 and (0.3-1.2).  Urinalysis was positive for 7-10 WBCs  but rare bacteria.  She was having no symptoms of a  UTI.   HOSPITAL COURSE:  Upon admission, patient was continued on her home  medications of Seroquel 100 mg p.o. q.h.s., lorazepam 1 mg p.o. 1/2 to 1  tablet in the morning.  Lorazepam 2 mg p.o. q.h.s. p.r.n. anxiety,  citalopram 20 mg 1/2 tab b.i.d., Lipitor 20 mg at bedtime, calcium  supplement 600 mg p.o. b.i.d., timolol ophthalmic solution 0.5% 1 drop  p.o. daily, Fosamax Plus D once weekly.  On December 30, 2006, she was  also started on her Xalatan 0.005% drops to both eyes daily and her  multivitamin daily.  She was started on Ativan 0.5 mg 1/2 p.o. T b.i.d.  p.r.n. anxiety and restarted on Zyprexa 2.5 mg p.o. q.h.s.  She was  started on Atenolol 0.5% 1 drop to both eyes daily.  Her Fosamax was  also changed to 70 mg every Wednesday morning.  On January 01, 2007,  patient's lorazepam was changed to 0.5 mg to 1 mg in the  morning p.r.n.  anxiety.  She did not want to take it on a standing basis and stated she  took it p.r.n. at home.  Patient tolerated her medications well except  for some initial sedation.  Several days into the hospital stay she  began to feel nauseated but there was a virus on the unit, and a number  of patients were nauseated.  This resolved and it was not felt to be due  to the Zyprexa as she had originally thought in the outpatient clinic.   Upon first meeting patient on December 30, 2006, her mood was depressed.  Affect tearful and constricted.  She denied suicidal ideation.  Stated  I want to feel better.  Was no homicidal ideation.  No auditory or  visual hallucinations.  There was psychomotor retardation with speech  soft and slow.  It was decided she needed to be back on her Zyprexa that  she had been treated with his an outpatient (but recently discontinued).  On December 31, 2006, patient slept well with Zyprexa 2.5 mg p.o. q.h.s.  she denied all nausea.  That a.m. she was eating well.  She stated she  was starting to feel a little better.  She discussed  missing her ex-  husband and told me this was also the 1-year anniversary of her sister's  death.  She denied suicidal or homicidal ideation.  She was tearful and  depressed as we talked about her losses.  On January 01, 2007, patient  stated she did not want to take Ativan in the morning and wants this  discontinued.  Changed this to a p.r.n.  She states feeling better now.  She is less anxious but still anxious somewhat.  She is worried about  going home.  It is difficult to be by herself and anxiety may worsen.  Sleep is good.  Appetite good.  On January 02, 2007, patient felt  nauseated but there was a virus on the unit with a number of patients  feeling nauseated.  She was more depressed that day due to her nausea  with a constricted affect.  On January 03, 2007, patient stated she got  sick after lunch the day before (the day she was nauseated and began to  have diarrhea she was changed to clear liquids and soft food).  She did  sleep well though had some diarrhea at night.  Appetite was poor due to  GI distress.  She worried about going home and is much more confined at  this time of year because of the weather.  She is trying to think of  activities for the daytime to keep her occupied.  She had become very  anxious at one point and the lorazepam helped.  1. On January 04, 2007, patient complained of heartburn.  She was      treated with Maalox.  Sleep had gone well.  Appetite decreased.      Her son came to visit last night.  Mood was still sad.  Trying to      come to terms with death of her husband.  Affect constricted.  No      suicidal or homicidal ideation.  The anniversary of her husband's      death is in one month.  She plans to keep busy by watching TV and      cleaning the house.  She also plans to go outside to a friendly      shopping center if the  weather allow because this is something she      enjoys.  On January 05, 2007, patient's mental status had improved      markedly from admission.  She was friendly and cooperative,      talkative with good eye contact.  Speech normal rate and flow.      Psychomotor activity within normal limits.  Mood still depressed      and anxious but less so than upon admission.  Affect still      constricted but able to smile at times.  There was no suicidal or      homicidal ideation.  No thoughts of self injurious behavior.  No      auditory or visual hallucinations.  No paranoia or delusions.      Thoughts were logical and goal-directed.  Thought content anxious      about her return home, for fear her mood will worsen again, and      cognitive exam was grossly within normal limits.   DISCHARGE DIAGNOSIS:  AXIS I:  Major depressive disorder; severe,  recurrent, without psychosis.  AXIS II:  None.  AXIS III:  Glaucoma.  AXIS IV:  Severe (upcoming first year anniversaries of the death of her  husband and her sister).  AXIS V:  Global assessment of functioning upon discharge was 50.  Global  assessment of functioning upon admission was 30.  Global assessment of  functioning highest past year 75-80.   DISCHARGE PLANS:  There were no specific activity level or dietary  restrictions.   DISCHARGE MEDICATIONS:  1. Seroquel 100 mg at bedtime  2. Fosamax with vitamin D every 7 days (last dose January 01, 2007).  3. Zyprexa 2.5 mg p.o. q.h.s.  4. Timoptic eye drops 0.5% 1 drop in each eye at bedtime.  5. Calcium carbonate tablets 500 mg twice daily with food.  6. Celexa 10 mg twice daily with food.  7. Latanoprost ophthalmic solution 0.005% 1 drop to each eye daily.  8. Multivitamin with  minerals 1 pill daily.  9. Lipitor 20 mg daily at bedtime.  10.Lorazepam 0.5 mg 1/2 to 1 tablet twice daily as needed for anxiety.   POST HOSPITAL CARE PLANS:  Patient will continue to see me in the [facility].  She has an appointment on February 7 at 2:45 p.m.Marland Kitchen  She will also follow up with hospice for therapy and  support groups to  deal with her grief.      Jasmine Pang, M.D.  Electronically Signed     BHS/MEDQ  D:  01/06/2007  T:  01/06/2007  Job:  [id number]

## 2011-04-20 NOTE — H&P (Signed)
NAME:  [name], [name]                    ACCOUNT NO.:  [id number]   MEDICAL RECORD NO.:  [id number]                   PATIENT TYPE:  IPS   LOCATION:  0303                                 FACILITY:  [facility]   PHYSICIAN:  Jeanice Lim, M.D.              DATE OF BIRTH:  June 21, 1927   DATE OF ADMISSION:  09/13/2002  DATE OF DISCHARGE:                         PSYCHIATRIC ADMISSION ASSESSMENT   IDENTIFYING INFORMATION:  This is a 77 year old white female who is a  voluntary admission.  She is married.   HISTORY OF PRESENT ILLNESS:  This is an older female referred by Dr. Valente David at [facility].  She called his office yesterday when she felt  she could not get through the night because she was so anxious and worried  she felt that she could not sleep.  She had also at the time of assessment  reported suicidal feelings and reported that she did have access to a weapon  in the home.  She reports that she has been particularly anxious and tearful  and worrying more than usual since the first of September when her husband  returned home after having brain surgery after he had fallen at home in the  middle of the night when he got up to go the bathroom.  Since that time, she  continuously worries about him falling and is unable to fall asleep at night  for fear that he will again get up, fall and hurt himself.  She reports that  she has always been a worry wort all of her life and for the past 2 months  now has had increased anxiety preventing her from being able to sleep.  She  reports increased tearfulness.  She denies any change in her appetite and  feels that she has been eating well.  She denies any auditory or visual  hallucinations, denies any homicidal ideation.  She denies suicidal ideation  this morning although she had reported these feelings yesterday at the time  of admission.  She has taken Ativan 0.5 mg at h.s. for approximately 10  years, prescribed when she was  diagnosed with ovarian cancer and stopped  this approximately 5 days ago.  She does report that she has considerable  history of panic attacks over the past couple of months but the frequency of  these is unclear.  She does report very intense worrying, particularly at  night.   PAST PSYCHIATRIC HISTORY:  The patient is followed by Dr. Valente David, M.D.  She has had one visit there, at which time he prescribed her Prozac 10 mg.  She has a history of taking Prozac with excellent results approximately 7  years ago and reports that it worked very well for her as long as she used  the brand name.  At one time she had received some generic Prozac which  caused her considerable stomach upset and nausea.  The patient also is  scheduled  to see Hilda Lias, a psychotherapist.  We are unclear on who this is.  This is her first psychiatric admission.   SOCIAL HISTORY:  The patient is married and living with her husband.  The  couple is from [place] and have lived in the [place] here since 1956,  to be near other family members.  They are both retired and live here in  [place] independently in their own home.  Her husband is handicapped from  diabetes and a cardiovascular accident several years ago.  He recently fell  getting up in the middle of the night and struck his head and had subsequent  brain surgery.  She denies any particular legal or financial constraints.  She does have insurance to help her pay for medications.  The patient has 2  children, a daughter who lives in [place] who is a single mother, and a son,  daughter-in-law and grandchildren who live here in [place].   FAMILY HISTORY:  Remarkable for a daughter with a history of anxiety and  panic attacks.   ALCOHOL AND DRUG HISTORY:  The patient denies any substance abuse.  She is a  nonsmoker.   PAST MEDICAL HISTORY:  The patient is followed by Dr. Lendell Caprice here in  [place] who is her primary care physician.  He is treating  her for  dyslipidemia, for occasional problems with GERD, and the patient reports  that she does have some constipation today, and that is a transient problem.  Past medical history is remarkable for her having a diagnosis of ovarian  cancer, with surgical intervention in 1989 and a recurrence is 1992.  The  patient also has a history of a right hip fracture approximately 10 years  ago and she reports that she has had some back surgery in the past.   REVIEW OF SYSTEMS:  Remarkable for some recent constipation.  The patient  reports that she has not had a bowel movement since October 12, but normally  she is not constipated.  She also reports some feeling that she is not  completely emptying her bladder and feels some bladder pressure.  She denies  any history of high blood pressure, denies any history of blackouts,  seizures, or memory loss.   MEDICATIONS:  Lorazepam 1 mg 1/2 to 1 tab p.o. q.h.s.  The patient has taken  this approximately 10 years.  Lipitor 10 mg p.o. daily, trazodone 50 mg.  The patient was prescribed 1/2 to 2 tablets to use at h.s. p.r.n. by Dr.  Ilsa Iha but she in fact did not take this, and Prozac 10 mg p.o. q.d. for 5  days.  The patient uses Adult nurse on [employer].   DRUG ALLERGIES:  TYLOX and SEASONAL ALLERGIES.   POSITIVE PHYSICAL FINDINGS:  The patient's last physical examination was  done on October 6 at [facility].  She is generally a normal female  with steady gait, normal motor exam, and we will obtain her physical  examination records from [facility].  She is afebrile today, with vital  signs on admission temperature 98.2, pulse 84, respirations 20, blood  pressure 153/78.  Laboratory studies reveal a normal CBC with a WBC of 7.1  thousand, RBCs at 3.89, hemoglobin 12.7, hematocrit 36.5.  Her MCV is 93.8,  platelets are 166,000.  Routine chemistries reveals her electrolytes are within normal limits.  Potassium 4.1, BUN 18,  creatinine 1.0.  Liver enzymes  are normal with a total bilirubin of 1.1.  Her SGOT is 18, SGPT 17.  Her  thyroid panel is pending.   MENTAL STATUS EXAM:  This is a medium-built female who is fully alert, with  a tearful and constricted affect, cooperative and pleasant.  Speech is  normal and clear.  Mood is depressed and anxious.  Thought process is  logical without any deficits.  No evidence of psychosis.  Although she is  quite anxious, there is no evidence of suicidal ideation today, no homicidal  ideation, no auditory or visual hallucinations.  Her thought content is  really dominated by her fears of going to sleep at night and continued  worries about her husband.  She fears that if she falls asleep or relaxes  that something will happen with him, that he will injure himself again and  she is unable to put these thoughts out of her mind.  However, she did  respond well to medication last evening and slept quite well last night.  Cognitively, she is intact and oriented x3.  Intelligence is average to  above average.  Insight good, judgment and impulse control within normal  limits.   ADMISSION DIAGNOSIS:   AXIS I:  1. Major depressive disorder, recurrent, severe.  2. Rule out anxiety disorder not otherwise specified.   AXIS II:  Deferred.   AXIS III:  Gastroesophageal reflux disease and constipation, dysuria not  otherwise specified rule out urinary tract infection, and dyslipidemia.   AXIS IV:  Moderate, caregiver stress.   AXIS V:  Current 38, past year 69.   INITIAL PLAN OF CARE:  Voluntarily admit the patient to alleviate her  suicidal ideation and her anxiety and to improve her sleep.  Our goal is to  alleviate any suicidal ideation which she has had and to allow her to sleep  and to return to her normal functioning at home. We will continue her Prozac  and increase that to 20 mg p.o. q.d.  We will restart her Ativan 0.5 mg p.o.  q.h.s. and she has relied on this for  approximately 10 years.  We will keep  her on it at this point and will allow her to have 0.5 q.6h. p.r.n. for  anxiety and note if that is a requirement how much she actually requires.  We will also give her trazodone 50 mg p.o. q.h.s.  and we will ask the case  manager to evaluate her home support system, including access to any weapons  in the home and to see if she needs some additional home care support in  caring for her husband.  Meanwhile, we have started her on intensive group  and individual psychotherapy daily.  For her GERD, we will give her  Ranitidine 150 mg p.o. b.i.d. on a p.r.n. basis, and we will also give her  some Colace 100 mg p.o. daily x3 for her constipation, and for her symptoms  of dysuria that she reported yesterday, she is completely afebrile with no  signs of infection, but we will get a clean-catch urine and we will culture  that, and we will continue her Lipitor 10 mg p.o. q.d. for her dyslipidemia.  ESTIMATED LENGTH OF STAY:  Four days.      Margaret A. Stephannie Peters                   Jeanice Lim, M.D.    MAS/MEDQ  D:  09/14/2002  T:  09/14/2002  Job:  [id number]

## 2011-04-20 NOTE — Discharge Summary (Signed)
NAME:  [name], [name]                    ACCOUNT NO.:  [id number]   MEDICAL RECORD NO.:  [id number]                   PATIENT TYPE:  IPS   LOCATION:  0301                                 FACILITY:  [facility]   PHYSICIAN:  Hipolito Bayley, M.D.               DATE OF BIRTH:  Dec 19, 1926   DATE OF ADMISSION:  09/13/2002  DATE OF DISCHARGE:  09/18/2002                                 DISCHARGE SUMMARY   INTRODUCTION:  The patient is a 77 year old white female who was admitted on  voluntary papers.  She was referred by Dr. Ilsa Pennington whom she called since she  did not feel like she could stand any more anxiety and lack of sleep.  She  reported some suicidal thoughts and reported that she has access to a weapon  at home.  The patient felt that her deterioration was attributed to a rapid  stoppage of Ativan which she was taken for approximately 10 years at  bedtime.  She gave history of panic attacks.  The patient was treated with  Prozac 10 mg with good results.  This dose seemed to be treating her  anxiety.   MEDICAL HISTORY:  Medically, the patient is followed by Dr. Lendell Caprice.  She  has problem with GERD and has hyperlipidemia, history of ovarian cancer with  surgical intervention in 1989 and recurrence in 1992.  Family reports she  also suffered hip fracture.   INITIAL DIAGNOSTIC IMPRESSION:  Axis I:  Major depressive disorder,  recurrent, severe and anxiety not otherwise specified.  Axis 5:  Global assessment of function on admission was 38, maximum  estimated 68 for past year.   HOSPITAL COURSE:  After being admitted to the ward, the patient was placed  on special observation.  The patient's physical examination was done only a  week ago at the emergency room at [facility] and was basically  normal.  The patient was placed on Prozac which was increased to 20 mg daily  and trazodone p.r.n. insomnia.  She was given Zantac 150 mg twice a day for  heartburn.  Since the patient  still did not sleep, Dr. Dub Mikes who was  attending physician at this point increased Ativan 1 mg at bedtime.  I saw  the patient on October 16 when she seemed to be on an improving course.  She  slept well, reported lack of dangerous ideations.  On October 17, the  patient continued stable course.  Anxiety almost subsided.  She tolerated  increased dose of Ativan well without change in blood pressure.  It was felt  that she had achieved maximum benefit from her hospitalization.  Her family  was okay with the idea of discharge and weapons at home which could be  accessed by the patient were removed.  At the time of discharge, she  presented with bright affect with absence of dangerous ideation or  psychosis.   MEDICAL PROBLEMS:  During this  brief hospital stay, the patient did not have  any major medical problems.  Her vital signs were stable, with blood  pressure 120/60, normal pulse and respiration rate and temperature.  A  review of her blood work showed normal CBC and diff, normal thyroid function  tests, normal chemistry 17, and normal urinalysis.  There was some increase  in leukocyte esterase.  Urine culture was performed but the result was not  available at time of discharge.  Subsequently, urine culture showed  Peptococcus sphaeroides and group D streptococci.   DISCHARGE DIAGNOSES:   AXIS I:  1. Major depression, recurrent, severe.  2. Anxiety disorder not otherwise specified.   AXIS II:  No diagnosis.   AXIS III:  Gastroesophageal reflux disease, constipation, urinary tract  infection.   AXIS IV:  Moderate stressor related to family problems.   AXIS V:  Global assessment of function maximum for past year 68, upon  discharge 65.   DISCHARGE MEDICATIONS:  1. Ativan 1 mg at bedtime.  2. Trazodone 50 mg at bedtime.  3. Prozac 20 mg daily.  4. Zantac 75 mg twice a day.  5. Lipitor 10 mg in the morning.   DISCHARGE RECOMMENDATIONS:  She is supposed to see her family  physician for  possible urinary tract infection and is supposed to stay on a cholesterol  diet.  She is going to see Dr. Ilsa Pennington on October 20.  In the event of  recurrence of symptoms, the patient will call her psychiatrist or come to  emergency room.  The patient understood instructions and in good condition  was discharged in care of her family.                                                 Hipolito Bayley, M.D.    JS/MEDQ  D:  11/16/2002  T:  11/17/2002  Job:  [id number]

## 2011-04-20 NOTE — H&P (Signed)
NAME:  [name], [name]                    ACCOUNT NO.:  [id number]   MEDICAL RECORD NO.:  [id number]                   PATIENT TYPE:  IPS   LOCATION:  0506                                 FACILITY:  [facility]   PHYSICIAN:  Hipolito Bayley, M.D.               DATE OF BIRTH:  [birth year]   DATE OF ADMISSION:  10/16/2002  DATE OF DISCHARGE:                         PSYCHIATRIC ADMISSION ASSESSMENT   IDENTIFYING INFORMATION:  The patient is a 77 year old married white female  voluntarily admitted on October 16, 2002.   HISTORY OF PRESENT ILLNESS:  The patient presents with a history of  depression and decompensating with her depression and having problems with  insomnia.  She is worried about her husband's illness with his recent  stroke.  She has had been stressed with her sister dying recently.  She  feels that things are changing.  She wants things to get back to how they  were.  She has been compliant with her medications.  Her appetite has been  satisfactory.  She denies any psychotic symptoms, denies any current  suicidal ideation.   PAST PSYCHIATRIC HISTORY:  This is the second hospitalization to [facility].  She was here less than one month ago for depression.  She  sees Milford Cage, M.D., as an outpatient.   SUBSTANCE ABUSE HISTORY:  She denies any alcohol or substance abuse.   PAST MEDICAL HISTORY:  Primary care [facility]: Dr. Lendell Caprice.  Medical  problems: Status post back surgery, status post ovarian CA, GERD, right hip  surgery, and glaucoma.   MEDICATIONS:  1. Celexa 10 mg b.i.d. for two weeks.  2. Protonix 40 mg q.a.m.  3. Ativan 1 mg q.h.s.; has been on that for 14 years.  4. Ambien 5 mg q.h.s.  5. Xalatan eye drops each eye.   DRUG ALLERGIES:  Questionable allergy to TYLOX.   PHYSICAL EXAMINATION:  GENERAL:  Physical examination was performed at last  hospitalization.  The patient continues to appear very well nourished  without complaints.  VITAL SIGNS:  Temperature 97.9, heart rate 83, respirations 18, blood  pressure 125/81.  She is 5 feet 2 inches tall.  She is 122 pounds.   LABORATORY DATA:  No recent labs at this time.   SOCIAL HISTORY:  This is a 77 year old married white female, married for 54  years.  She has two children.  She lives with her husband.   FAMILY HISTORY:  None.   MENTAL STATUS EXAM:  She is an alert, elderly, cooperative female, casually  dressed, good eye contact.  Speech is clear.  Mood is hopeless, depressed.  Affect is very tearful throughout the interview.  Thought processes are  coherent; no evidence of psychosis.  The patient is ruminating over the  patient's illness and how things have been changing.  Cognitive: Intact.  Memory is good.  Judgment and insight are fair.   ADMISSION DIAGNOSES:   AXIS I:  Major depression, recurrent,  severe.   AXIS II:  Deferred.   AXIS III:  1. Status post back surgery.  2. Glaucoma.  3. Right hip surgery.  4. Ovarian cancer.   AXIS IV:  Problems with primary support group with husband's illness,  medical problems, other psychosocial problems.   AXIS V:  Current is 35, this past year is 74.   INITIAL PLAN OF CARE:  Plan is a voluntary admission to [facility] for depression and decompensating behavior.  Contract for safety,  check every 15 minutes.  Will resume her medications.  Will initiate Zyprexa  for ruminating thoughts.  Family session prior to discharge with children  and husband if he is able.  The patient is to follow up with Dr. Katrinka Blazing, to  stabilize mood and thinking so the patient can be safe.   ESTIMATED LENGTH OF STAY:  Three to five days.     Landry Corporal, N.P.                       Hipolito Bayley, M.D.    JO/MEDQ  D:  10/20/2002  T:  10/20/2002  Job:  [id number]

## 2011-04-20 NOTE — Discharge Summary (Signed)
NAME:  ANGELISSE, RISO                    ACCOUNT NO.:  [id number]   MEDICAL RECORD NO.:  [id number]                   PATIENT TYPE:  IPS   LOCATION:  0506                                 FACILITY:  [facility]   PHYSICIAN:  Jeanice Lim, M.D.              DATE OF BIRTH:  08/24/1927   DATE OF ADMISSION:  10/16/2002  DATE OF DISCHARGE:  10/22/2002                                 DISCHARGE SUMMARY   IDENTIFYING DATA:  A 77 year old married Caucasian  female voluntarily  admitted with a history of depression, decompensating, had bouts with  insomnia. Worried about her husband's illness and recent stroke. Having  difficulty caring for herself, feeling overwhelmed.   MEDICATIONS:  1. Celexa 10 mg b.i.d. x 2 weeks.  2. Protonix 40 mg q. a.m.  3. Ativan 1 mg q.h.s. x 14 years.  4. Ambien 5 mg q.h.s.  5. Xalatan eye drops.   PHYSICIANS:  The patient has been followed up by Milford Cage as an  outpatient and [facility].   ALLERGIES:  DRUG ALLERGIES TO TYLOX.   PHYSICAL EXAMINATION:  Within normal limits. Neurologically nonfocal.   LABORATORY DATA:  Routine admission labs essentially within normal limits.   MENTAL STATUS EXAM:  Alert, elderly, cooperative female, casually dressed,  good eye contact, speech clear. Mood hopeless and  depressed. Affect tearful  and anxious. Thought process goal directed. Thought content ruminating over  the patient's illness and how things have changed and husband's illness.  Cognitively intact. Memory is fair and judgment and insight are fair.   ADMISSION DIAGNOSES:   AXIS I:  Major depression, single episode, severe.   AXIS II:  None.   AXIS III:  Status post  back surgery, glaucoma, right hip surgery, ovarian  cancer.   AXIS IV:  Severe problems with primary support group related to husband's  illness, medical problems and other psychosocial issues.   AXIS V:  35/70.   HOSPITAL COURSE:  The patient was admitted and ordered  routine p.r.n.  medications. She underwent further monitoring. She was encouraged to  participate in individual group and milieu therapy. She was resumed on  Celexa 10 mg b.i.d., Protonix, Ativan, Restoril, Lipitor and Xalatan. The  patient was encouraged to drink Gatorade and was started on Zyprexa for  agitated depression and Restoril stopped to continue only one benzodiazepine  in this elderly patient. The Zyprexa was then discontinued and the patient  was given a trial on Seroquel to restore sleep and for adjunctive treatment  of mood and agitation associated with depression. The patient tolerated this  medication well and participated in aftercare planning. She reported gradual  improvement with clinical intervention.   The family participated in aftercare planning and the patient's condition on  discharge was improved. Mood was more euthymic, affect brighter, thought  process is goal directed, thought content negative for any dangerous  ideation or psychotic symptoms. The patient reported feeling more hopeful  and able to handle outside psychosocial stressors. The patient was  discharged on medication.   DISCHARGE MEDICATIONS:  1. Protonix 40 mg q.d.  2. Zocor 20 mg 6 p.m.  3. Xalatan ophthalmic solution.  4. Timolol ophthalmic solution.  5. Ativan 1 mg q.h.s. and 1/2 q.12h.  6. Celexa 10 mg b.i.d.  7. Seroquel 100 mg 1 to 2 q.h.s.   FOLLOW UP:  The patient is to follow up with Milford Cage within seven days  from discharge.   DISCHARGE DIAGNOSES:   AXIS I:  Major depression, single episode, severe.   AXIS II:  None.   AXIS III:  Status post  back surgery, glaucoma, right hip surgery, ovarian  cancer.   AXIS IV:  Severe problems with primary support group related to husband's  illness, medical problems and other psychosocial issues.   AXIS VKallie Locks, M.D.    JEM/MEDQ  D:  11/05/2002  T:  11/05/2002   Job:  [id number]

## 2011-04-20 NOTE — Discharge Summary (Signed)
[name], [name] NO.:  [id number]   MEDICAL RECORD NO.:  [id number]          PATIENT TYPE:  IPS   LOCATION:  0300                          FACILITY:  [facility]   PHYSICIAN:  Anselm Jungling, MD  DATE OF BIRTH:  04/08/1927   DATE OF ADMISSION:  02/13/2007  DATE OF DISCHARGE:  02/26/2007                               DISCHARGE SUMMARY   IDENTIFYING DATA/REASON FOR ADMISSION:  The patient is a 77 year old  widowed female who was admitted due to increasing anxiety, panic  episodes, and inability to function on her own, adequately at home.  Please refer to the admission note for further details pertaining to the  symptoms, circumstances and history that led to her hospitalization.   INITIAL DIAGNOSTIC IMPRESSION:  She was given initial AXIS I diagnoses  of mood disorder not otherwise specified, and panic disorder with  agoraphobia.   MEDICAL/LABORATORY:  The patient had received a CT scan of the abdomen  during the week prior to admission due to gastrointestinal symptoms.  The patient was to have follow-up regarding these imaging studies  following her treatment in our inpatient psychiatric service.  She was  medically and physically assessed by the psychiatric nurse practitioner.  She was continued on her usual Zocor 40 mg as an alternative to Lipitor  20 mg daily, as well as her usual eye drops, calcium tablet and Fosamax  every seven days.  There were no acute medical issues.   HOSPITAL COURSE:  The patient was admitted to the adult inpatient  psychiatric service.  She was initially evaluated and treatment  initiated by Dr. Milford Cage.  The undersigned and Dr. Katrinka Blazing both  remained involved in her care throughout the remainder of her stay.   The patient presented as a sweet, elderly woman who was quite anxious  and frequently tearful.  She openly discussed her grieving for her  husband, who had died almost exactly one year prior to the time of this  inpatient admission.  She was treated with a psychotropic regimen  involving Lexapro, Klonopin, and Seroquel at bedtime to address  insomnia.   Her adult son was involved in her treatment and communicated frequently  with case management.  There was a family session on the eighth hospital  day involving the patient's son and his wife.   The patient continued quite anxious throughout her inpatient stay, and  was sad and depressed as well.  However, she was absent suicidal  ideation.   We had concerns about Lattie Corns returning to her home, where she had  been living alone.  We suggested to [facility] and her son the  possibility of her going to some form of assisted-living facility, but  she firmly rejected this as a possibility.   The patient was discharged on the 14th hospital day.  She agreed to the  following aftercare plan.   AFTERCARE:  The patient was to follow up with Dr. Milford Cage for  outpatient care with an appointment on March 04, 2007.  In addition, she  was to see Jamelle Haring for individual counseling with an appointment on  March 03, 2007.  DISCHARGE MEDICATIONS:  1. Seroquel 100 mg q.h.s.  2. Lexapro 10 mg daily.  3. Klonopin 0.5 mg t.i.d.  4. Protonix 40 mg b.i.d.  5. Lipitor 20 mg daily.  6. Timoptic eye drops as previously prescribed.  7. Xalatan eye drops as previously prescribed.  8. Calcium tablet twice daily.  9. Fosamax every seven days.   DISCHARGE DIAGNOSES:  AXIS I:  Panic disorder not otherwise specified.  Bereavement.  AXIS II:  Deferred.  AXIS III:  History of hypertension, gastrointestinal symptoms, unknown  etiology.  AXIS IV:  Stressors:  Severe.  AXIS V:  GAF on discharge 65.      Anselm Jungling, MD  Electronically Signed     SPB/MEDQ  D:  03/07/2007  T:  03/07/2007  Job:  [id number]

## 2011-04-20 NOTE — Discharge Summary (Signed)
NAMESHARDEA, [name] NO.:  [id number]   MEDICAL RECORD NO.:  [id number]          PATIENT TYPE:  IPS   LOCATION:  0507                          FACILITY:  [facility]   PHYSICIAN:  Jasmine Pang, M.D. DATE OF BIRTH:  [birth year]   DATE OF ADMISSION:  01/20/2007  DATE OF DISCHARGE:  01/26/2007                               DISCHARGE SUMMARY   IDENTIFYING INFORMATION:  This is a 77 year old widowed white female who  was admitted on a voluntary basis on January 20, 2007.   HISTORY OF PRESENT ILLNESS:  The patient presents with a history of  depression and suicidal thoughts with a plan to overdose.  She has been  grieving over her husband's death.  It is almost a one-year anniversary  of his death after a marriage of 53 years.  The patient was recently  discharged from our facility for similar symptoms.  She is having  problems with the nausea that she attributes to her bedtime medication  and she states this is currently her biggest problem.   PAST PSYCHIATRIC HISTORY:  This is the third admission to the [facility].  She was recently discharged on January 05, 2007.  She  sees me for outpatient mental health services.   ALCOHOL/DRUG HISTORY:  The patient denies any alcohol or drug use.   MEDICAL PROBLEMS:  Glaucoma.   MEDICATIONS:  Ativan 0.5 mg to 1 mg p.o. daily and 1-2 mg p.o. p.r.n.  q.h.s., Lipitor 20 mg daily, women's multivitamin daily with calcium,  Celexa 10 mg b.i.d., Seroquel 100 mg at bedtime, calcium with vitamin D  100 mg, 2 p.o. daily, Timolol eye drops 0.5%, 1 drop each eye daily,  Xalatan eye drops 0.005%, 1 drop daily each eye, Fosamax Plus D once a  week on Wednesday.   ALLERGIES:  She has no known drug allergies.   PHYSICAL EXAMINATION:  The patient was evaluated by our nurse  practitioner.  She had no acute physical problems and was in no acute  distress.   LABORATORY DATA:  No current lab work is available.  The patient was  recently discharged approximately one week ago and repeat labs were not  done.  There were no abnormalities noted on her previous labs.   HOSPITAL COURSE:  Upon admission, the patient was continued on Ativan  0.5 mg to 1 mg p.o. daily and 1-2 mg p.o. p.r.n. q.h.s., Lipitor 20 mg  p.o. q.h.s., women's 1-A-Day vitamin, 1 pill p.o. daily, Celexa 10 mg  p.o. b.i.d., Seroquel 100 mg p.o. q.h.s., calcium with vitamin D 2 pills  p.o. daily, Timolol 0.5%, 1 drop to each eye daily, Xalatan 0.005%, 1  drop to each eye daily, Fosamax Plus D p.o. once a week.  On January 21, 2007, the patient's Seroquel was discontinued.  She was started on  Risperdal 0.25 mg p.o. q.h.s. and Protonix 40 mg daily.  The Fosamax was  also discontinued.  Her primary care physician had suggested not to take  Fosamax x1 month until reassessed by him since this may be causing some  GI distress.  On January 30, 2007,  Seroquel 100 mg p.o. q.h.s., may  repeat x1 p.r.n. insomnia was started.  On January 23, 2007, Celexa was  increased to 10 mg in the morning and 20 mg at h.s.  The patient  tolerated her medications well with no significant side effects.   Upon first meeting with the patient, she stated she had a worsening of  mood with severe depression after her discharge one week before.  I  decided to add Risperdal to her regimen of meds for better mood  stabilization.  On January 22, 2007, the patient did not sleep well the  night before without her Seroquel.  She wants this back.  The Seroquel  100 mg p.o. q.h.s. was restarted.  Dr. Lendell Caprice, her primary care  physician, felt the Fosamax may be causing some of her GI distress.  She  recommended stopping this for one month until she could see Dr. Lendell Caprice  again.  She was also placed on Protonix.  We discussed the possibility  of assisted-living after discharge but the patient wanted to return  home.  She had thoughts of wanting to be dead I would like it better to   be with guy.  Her children are a major deterrent from suicide.  She  contracted for safety on the unit.  On January 23, 2007, the patient  was still anxious about what things will be like at home.  Mood was  depressed, anxious, affect consistent with mood.  She still had thoughts  that would rather be with her deceased husband but contracts for safety.  No auditory or visual hallucinations.  On January 24, 2007, the patient  was feeling somewhat sedate in the morning from her medication.  She was  anxious about going home but again did not want to live in assisted-  living.  Other than the sedation, there were no side effects from  medications.  At this point, she was on Celexa 10 in the morning and 20  mg at bedtime and Risperdal 0.5 mg p.o. q.h.s. and Seroquel 100 mg p.o.  q.h.s.  She had no suicidal or homicidal ideation.  She stated she feels  she wants to live.  No auditory or visual hallucinations.  On January 25, 2007, the patient slept well.  She felt better.  She plans to go  home the following day.  She had slight nausea but this had improved.  Her son was planning to pick her up tomorrow.  Current medications were  continued.  On January 26, 2007, the patient's mental status had  improved.  She was friendly and cooperative and conversant.  She had  good eye contact.  Her psychomotor activity was within normal limits.  Her mood was euthymic.  Affect wide range.  There was no suicidal or  homicidal ideation.  No thoughts of self-injurious behavior.  No  auditory or visual hallucinations.  No paranoia or delusions.  Thoughts  were logical and goal-directed.  Thought content no predominant theme.  Cognitive exam was grossly back to baseline.  It was felt the patient  was stable enough to be discharged today.   DISCHARGE DIAGNOSES:  AXIS I:  Major depressive disorder, recurrent,  severe.  AXIS II:  None. AXIS III:  Glaucoma.  AXIS IV:  Severe (psychosocial problems related to  her husband's death).  AXIS V:  GAF upon discharge 50; GAF upon admission 35; GAF highest past  year 65.   ACTIVITY/DIET:  There were no specific activity or dietary restrictions.  The patient was to follow up with Dr. Lendell Caprice, her primary care  physician.   DISCHARGE MEDICATIONS:  1. Lorazepam 0.5 mg, 1-2 daily and 1-2 as needed for anxiety at      bedtime.  2. Lipitor 20 mg at bedtime.  3. Multivitamin 1 p.o. q.d.  4. Calcium carbonate with vitamin D 1 p.o. q.d.  5. Xalatan 0.005% ophthalmic solution 1 drop daily.  6. Timoptic 0.5% ophthalmic solution 1 drop at bedtime.  7. Risperdal 0.25 mg at bedtime.  8. Protonix 40 mg daily.  9. Seroquel 100 mg at bedtime.  10.Celexa 10 mg in the morning and 20 mg at bedtime.   She was instructed not to use Fosamax for one month until she saw her  primary care physician, Dr. Lendell Caprice.   POST-HOSPITAL CARE PLANS:  The patient will return to see me in the  [facility].  She will also  see her outpatient therapist, Dr. Jamelle Haring, and these appointments  are being arranged by our casemanager.      Jasmine Pang, M.D.  Electronically Signed     BHS/MEDQ  D:  03/05/2007  T:  03/05/2007  Job:  [id number]

## 2011-08-27 LAB — BASIC METABOLIC PANEL
BUN: 17
CO2: 24
Chloride: 108
Creatinine, Ser: 0.84

## 2011-08-27 LAB — CBC
HCT: 38.7
MCV: 95.8
Platelets: 169
RBC: 4.04
WBC: 6.9

## 2011-09-19 ENCOUNTER — Other Ambulatory Visit: Payer: Self-pay | Admitting: Gastroenterology

## 2011-09-19 ENCOUNTER — Other Ambulatory Visit: Payer: Self-pay | Admitting: Family Medicine

## 2011-09-19 DIAGNOSIS — R110 Nausea: Secondary | ICD-10-CM

## 2011-09-20 LAB — CBC
Hemoglobin: 14
RBC: 4.33
RDW: 13

## 2011-09-20 LAB — COMPREHENSIVE METABOLIC PANEL
ALT: 25
AST: 20
Alkaline Phosphatase: 88
GFR calc Af Amer: >60
Glucose, Bld: 193 — ABNORMAL HIGH
Potassium: 3.8
Sodium: 137
Total Protein: 6.9

## 2011-09-20 LAB — DRUGS OF ABUSE SCREEN W/O ALC, ROUTINE URINE
Amphetamine Screen, Ur: NEGATIVE
Barbiturate Quant, Ur: NEGATIVE
Benzodiazepines.: NEGATIVE
Phencyclidine (PCP): NEGATIVE

## 2011-09-20 LAB — HEPATIC FUNCTION PANEL
ALT: 24
Alkaline Phosphatase: 86
Bilirubin, Direct: 0.1
Indirect Bilirubin: 0.9
Total Bilirubin: 1

## 2011-09-20 LAB — URINALYSIS, ROUTINE W REFLEX MICROSCOPIC
Bilirubin Urine: NEGATIVE
Glucose, UA: NEGATIVE
Ketones, ur: NEGATIVE
pH: 6.5

## 2011-09-20 LAB — TSH: TSH: 3.534

## 2011-09-20 LAB — URINE MICROSCOPIC-ADD ON

## 2011-09-24 ENCOUNTER — Other Ambulatory Visit: Payer: Self-pay

## 2011-10-29 ENCOUNTER — Ambulatory Visit
Admission: RE | Admit: 2011-10-29 | Discharge: 2011-10-29 | Disposition: A | Discharge status: Home / Self Care | Payer: Medicare Other | Source: Ambulatory Visit | Attending: Gastroenterology | Admitting: Gastroenterology

## 2011-10-29 DIAGNOSIS — R110 Nausea: Secondary | ICD-10-CM

## 2011-12-03 ENCOUNTER — Other Ambulatory Visit: Payer: Self-pay | Admitting: Gastroenterology

## 2011-12-03 DIAGNOSIS — R110 Nausea: Secondary | ICD-10-CM

## 2011-12-10 ENCOUNTER — Ambulatory Visit
Admission: RE | Admit: 2011-12-10 | Discharge: 2011-12-10 | Disposition: A | Discharge status: Home / Self Care | Payer: Medicare Other | Source: Ambulatory Visit | Attending: Gastroenterology | Admitting: Gastroenterology

## 2011-12-10 DIAGNOSIS — R110 Nausea: Secondary | ICD-10-CM

## 2011-12-10 MED ORDER — IOHEXOL 300 MG/ML  SOLN
100.0000 mL | Freq: Once | INTRAMUSCULAR | Status: AC | PRN
  Administered 2011-12-10: 100 mL via INTRAVENOUS

## 2012-04-01 ENCOUNTER — Other Ambulatory Visit: Payer: Self-pay | Admitting: Dermatology

## 2012-07-30 ENCOUNTER — Emergency Department (HOSPITAL_COMMUNITY)
Admission: EM | Admit: 2012-07-30 | Discharge: 2012-07-30 | Disposition: A | Discharge status: Home / Self Care | Payer: Medicare Other | Attending: Emergency Medicine | Admitting: Emergency Medicine

## 2012-07-30 ENCOUNTER — Encounter (HOSPITAL_COMMUNITY): Payer: Self-pay | Admitting: Emergency Medicine

## 2012-07-30 DIAGNOSIS — R112 Nausea with vomiting, unspecified: Secondary | ICD-10-CM | POA: Insufficient documentation

## 2012-07-30 DIAGNOSIS — R42 Dizziness and giddiness: Secondary | ICD-10-CM | POA: Insufficient documentation

## 2012-07-30 DIAGNOSIS — A059 Bacterial foodborne intoxication, unspecified: Secondary | ICD-10-CM

## 2012-07-30 HISTORY — DX: Major depressive disorder, single episode, unspecified: F32.9

## 2012-07-30 HISTORY — DX: Depression, unspecified: F32.A

## 2012-07-30 HISTORY — DX: Unspecified glaucoma: H40.9

## 2012-07-30 MED ORDER — ONDANSETRON HCL 4 MG/2ML IJ SOLN
INTRAMUSCULAR | Status: AC
  Administered 2012-07-30: 17:00:00
  Filled 2012-07-30: qty 2

## 2012-07-30 NOTE — ED Provider Notes (Signed)
I saw and evaluated the patient, reviewed the resident's note and I agree with the findings and plan. Patient with abrupt onset of nausea and vomiting he is now feeling better. She has no cardiac history and her EKG is within normal limits. Several other residents in her facility also had more symptoms which most likely is food related. Patient has no abdominal pain and a normal exam here. She is tolerating by mouth's and was discharged home. I saw and evaluated the EKG and agree with the resident's interpretation  Gwyneth Sprout, MD 07/30/12 1932

## 2012-07-30 NOTE — ED Notes (Signed)
Pt reports she had just eaten lunch when she all of a sudden became dizzy then nauseous and then vomited. Pt denies CP, pt denies abd pain. Pt reports she no longer feels dizzy. Pt tearful about situation.

## 2012-07-30 NOTE — ED Provider Notes (Signed)
History     CSN: [id number]  Arrival date & time 07/30/12  1727   First MD Initiated Contact with Patient 07/30/12 1742      Chief Complaint  Patient presents with  . Nausea  . Emesis  . Dizziness    Patient is a 78 y.o. female presenting with vomiting.  Emesis     78 y/o woman with a history of depression, presnting with acute nausea and vomiting.  She ate lunch at 12:30.  Then at 16:00, suddenly became nauseated and vomited.  Vomitus was gastric contents.  No bile or blood seen.  Pt then became very sweaty and dizzy.  EMS administered Zofran and brought her to [facility].  Pt now denies dizziness and nausea.  She denies headaches, chest pain, dyspnea, abdominal pain, diarrhea, constipation, fevers, chills, and dysuria.  Report from EMS is that several other residents at her facility became sick this afternoon as well.  Past Medical History  Diagnosis Date  . Depression   . Glaucoma     History reviewed. No pertinent past surgical history.  No family history on file.  History  Substance Use Topics  . Smoking status: Not on file  . Smokeless tobacco: Not on file  . Alcohol Use: Not on file    OB History    Grav Para Term Preterm Abortions TAB SAB Ect Mult Living                  Review of Systems  Gastrointestinal: Positive for vomiting.  All other systems reviewed and are negative.    Allergies  Review of patient's allergies indicates no known allergies.  Home Medications   Current Outpatient Rx  Name Route Sig Dispense Refill  . ATORVASTATIN CALCIUM 20 MG PO TABS Oral Take 20 mg by mouth daily.    Marland Kitchen LATANOPROST 0.005 % OP SOLN Both Eyes Place 1 drop into both eyes at bedtime.     Marland Kitchen LORAZEPAM 0.5 MG PO TABS Oral Take 0.5 mg by mouth at bedtime.    Marland Kitchen MIRTAZAPINE 30 MG PO TABS Oral Take 30 mg by mouth at bedtime.    Marland Kitchen QUETIAPINE FUMARATE 100 MG PO TABS Oral Take 100 mg by mouth at bedtime.    Marland Kitchen RISEDRONATE SODIUM 35 MG PO TABS Oral Take 35 mg by mouth every 7  (seven) days. mondays    . SERTRALINE HCL 100 MG PO TABS Oral Take 100 mg by mouth daily.      BP 139/77  Pulse 69  Temp 98 F (36.7 C) (Oral)  Resp 17  SpO2 98%  Physical Exam  Constitutional: Vital signs are normal. She appears well-developed and well-nourished. She is cooperative.  Non-toxic appearance. No distress.  HENT:  Head: Normocephalic and atraumatic.  Mouth/Throat: Uvula is midline, oropharynx is clear and moist and mucous membranes are normal.  Eyes: Conjunctivae and EOM are normal. Pupils are equal, round, and reactive to light.  Neck: Normal range of motion and full passive range of motion without pain. Neck supple.  Cardiovascular: Normal rate and regular rhythm.   Pulmonary/Chest: Effort normal and breath sounds normal.  Abdominal: Soft. Normal appearance and bowel sounds are normal. She exhibits no mass. There is no hepatosplenomegaly. There is no tenderness.  Neurological: She is alert.  Skin: Skin is warm, dry and intact.    ED Course  Procedures (including critical care time)  Labs Reviewed - No data to display No results found.   No diagnosis found.  EKG  Results: Rate:  70 PR:  136 QRS:  88 QTc:  475 EKG: normal EKG, normal sinus rhythm.    MDM  Sudden onset, quick resolution, and absence of other symptoms support food poisoning.  Other etiologies of N/V including gastroenteritis, mechanical blockage, and cardiac ischemia are all very unlikely   Pt tolerating oral intake well here. Safe for d/c back to facility.       Lollie Sails, MD 07/30/12 1911

## 2012-07-30 NOTE — ED Notes (Signed)
Per EMS - pt was eating lunch when she became dizzy all of a sudden then nauseous then proceeded to vomit. Upon EMS arrival pt was diaphoretic and clammy. EMS inserted 20 G in L forearm and administered 4mg  of Zofran.  EMS EKG unremarkable, NSR with occasional PVC.

## 2014-05-05 ENCOUNTER — Encounter (HOSPITAL_COMMUNITY): Payer: Self-pay | Admitting: Emergency Medicine

## 2014-05-05 ENCOUNTER — Emergency Department (HOSPITAL_COMMUNITY)
Admission: EM | Admit: 2014-05-05 | Discharge: 2014-05-05 | Disposition: A | Discharge status: Home / Self Care | Payer: Medicare Other | Attending: Emergency Medicine | Admitting: Emergency Medicine

## 2014-05-05 DIAGNOSIS — H409 Unspecified glaucoma: Secondary | ICD-10-CM | POA: Insufficient documentation

## 2014-05-05 DIAGNOSIS — Z79899 Other long term (current) drug therapy: Secondary | ICD-10-CM | POA: Insufficient documentation

## 2014-05-05 DIAGNOSIS — F3289 Other specified depressive episodes: Secondary | ICD-10-CM | POA: Insufficient documentation

## 2014-05-05 DIAGNOSIS — IMO0002 Reserved for concepts with insufficient information to code with codable children: Secondary | ICD-10-CM | POA: Insufficient documentation

## 2014-05-05 DIAGNOSIS — R110 Nausea: Secondary | ICD-10-CM | POA: Insufficient documentation

## 2014-05-05 DIAGNOSIS — R451 Restlessness and agitation: Secondary | ICD-10-CM

## 2014-05-05 DIAGNOSIS — F329 Major depressive disorder, single episode, unspecified: Secondary | ICD-10-CM | POA: Insufficient documentation

## 2014-05-05 LAB — COMPREHENSIVE METABOLIC PANEL
ALT: 19 U/L (ref 0–35)
AST: 19 U/L (ref 0–37)
Albumin: 4 g/dL (ref 3.5–5.2)
Alkaline Phosphatase: 90 U/L (ref 39–117)
BUN: 13 mg/dL (ref 6–23)
CO2: 21 mEq/L (ref 19–32)
Calcium: 9.6 mg/dL (ref 8.4–10.5)
Chloride: 102 mEq/L (ref 96–112)
Creatinine, Ser: 0.9 mg/dL (ref 0.50–1.10)
GFR calc Af Amer: 66 mL/min — ABNORMAL LOW (ref >90)
GFR calc non Af Amer: 57 mL/min — ABNORMAL LOW (ref >90)
Glucose, Bld: 150 mg/dL — ABNORMAL HIGH (ref 70–99)
Potassium: 4.2 mEq/L (ref 3.7–5.3)
Sodium: 139 mEq/L (ref 137–147)
Total Bilirubin: 1.5 mg/dL — ABNORMAL HIGH (ref 0.3–1.2)
Total Protein: 7.3 g/dL (ref 6.0–8.3)

## 2014-05-05 LAB — CBC WITH DIFFERENTIAL/PLATELET
Basophils Absolute: 0 10*3/uL (ref 0.0–0.1)
Basophils Relative: 0 % (ref 0–1)
Eosinophils Absolute: 0 10*3/uL (ref 0.0–0.7)
Eosinophils Relative: 0 % (ref 0–5)
HCT: 43.5 % (ref 36.0–46.0)
Hemoglobin: 14.5 g/dL (ref 12.0–15.0)
Lymphocytes Relative: 10 % — ABNORMAL LOW (ref 12–46)
Lymphs Abs: 0.9 10*3/uL (ref 0.7–4.0)
MCH: 32.3 pg (ref 26.0–34.0)
MCHC: 33.3 g/dL (ref 30.0–36.0)
MCV: 96.9 fL (ref 78.0–100.0)
Monocytes Absolute: 0.5 10*3/uL (ref 0.1–1.0)
Monocytes Relative: 5 % (ref 3–12)
Neutro Abs: 7.6 10*3/uL (ref 1.7–7.7)
Neutrophils Relative %: 85 % — ABNORMAL HIGH (ref 43–77)
Platelets: 134 10*3/uL — ABNORMAL LOW (ref 150–400)
RBC: 4.49 MIL/uL (ref 3.87–5.11)
RDW: 12.9 % (ref 11.5–15.5)
WBC: 9 10*3/uL (ref 4.0–10.5)

## 2014-05-05 LAB — URINALYSIS, ROUTINE W REFLEX MICROSCOPIC
Bilirubin Urine: NEGATIVE
Glucose, UA: NEGATIVE mg/dL
Hgb urine dipstick: NEGATIVE
Ketones, ur: NEGATIVE mg/dL
Nitrite: NEGATIVE
Protein, ur: NEGATIVE mg/dL
Specific Gravity, Urine: 1.006 (ref 1.005–1.030)
Urobilinogen, UA: 0.2 mg/dL (ref 0.0–1.0)
pH: 7 (ref 5.0–8.0)

## 2014-05-05 LAB — URINE MICROSCOPIC-ADD ON

## 2014-05-05 MED ORDER — ONDANSETRON 4 MG PO TBDP
4.0000 mg | ORAL_TABLET | Freq: Once | ORAL | Status: AC
  Administered 2014-05-05: 4 mg via ORAL
  Filled 2014-05-05: qty 1

## 2014-05-05 MED ORDER — GI COCKTAIL ~~LOC~~
30.0000 mL | Freq: Once | ORAL | Status: AC
  Administered 2014-05-05: 30 mL via ORAL
  Filled 2014-05-05: qty 30

## 2014-05-05 NOTE — Progress Notes (Signed)
CSW met with pt and pt family at bedside regarding referral for placement. Per discussion with pt and family, pt expressed concern about feeling scared being at home alone. Pt has chronic nausea which appears to possibly be tied to anxiety. Pt is followed by [facility], and can bee seen by a therapist too however pt has refused in the past. Patient states she will feel better if son will stay with patient. Patient son states he can stay there tonight, but they will be looking into hiring around the clock sitting services for the future or pt returning to [facility]. Pt to follow up with PCP who can assist with placement later today.   Noreene Larsson [phone]  ED CSW 05/05/2014 1250pm

## 2014-05-05 NOTE — ED Notes (Signed)
Bed: UM35 Expected date:  Expected time:  Means of arrival:  Comments: EMS- 80yo F, "just not acting right"

## 2014-05-05 NOTE — Discharge Instructions (Signed)
Called Dr. Selena Batten for assistance with placement into a facility. return here as needed

## 2014-05-05 NOTE — ED Provider Notes (Signed)
Level V caveat patient does not answer all questions. History is obtained from patient's son and daughter-in-law who accompany her. Patient refused to get her blood drawn for "ring routine lab work ordered by her primary care physician this morning. She also picked up a flashlight, not know what to do with it. Her son suspects that she suffers from dementia. She's had similar outbursts for many years, sometimes becoming agitated. She is now acting as her baseline, pleasant and cooperative, without treatment. Patient has lived in assisted living facilities intermittently for several years. She presently has helpers at home and family who checks in on her regularly. Son and daughter-in-law feel that she is safe at home. Patient has complained of nausea constantly for several years. She is presently hungry. Also has had reddened right eye lids for several years, unchanged On exam patient is pleasant cooperative alert follows simple commands when name and hospital does not know date tenderness 2 through 12 grossly intact heart regular in rhythm lungs clear auscultation abdomen nontender all 4 extremities without redness or tenderness neurovascular intact. Gait normal   Doug Sou, MD 05/05/14 [phone]

## 2014-05-05 NOTE — ED Provider Notes (Signed)
CSN: [id number]     Arrival date & time 05/05/14  9937 History   First MD Initiated Contact with Patient 05/05/14 0813     Chief Complaint  Patient presents with  . Agitation  . Altered Mental Status     (Consider location/radiation/quality/duration/timing/severity/associated sxs/prior Treatment) HPI Patient presents to the emergency department with increased agitation, this morning.  The patient was due to have blood work with her family doctor.  The patient reports nausea, but the son states, that this has been a chronic ongoing problem for a long period of time.  Patient reports feeling, nausea but has not vomited.  The patient is back to her baseline per the son.  Son states, that she seemed to be somewhat out of sorts.  Patient denies chest pain, shortness of breath, vomiting, diarrhea, weakness, fever, dizziness, headache, blurred vision, back pain, dysuria, or syncope son states, that she has most likely some dementia. Past Medical History  Diagnosis Date  . Depression   . Glaucoma    History reviewed. No pertinent past surgical history. History reviewed. No pertinent family history. History  Substance Use Topics  . Smoking status: Not on file  . Smokeless tobacco: Not on file  . Alcohol Use: Not on file   OB History   Grav Para Term Preterm Abortions TAB SAB Ect Mult Living                 Review of Systems  All other systems negative except as documented in the HPI. All pertinent positives and negatives as reviewed in the HPI.  Allergies  Review of patient's allergies indicates no known allergies.  Home Medications   Prior to Admission medications   Medication Sig Start Date End Date Taking? Authorizing Provider  acetaminophen (TYLENOL) 500 MG tablet Take 250 mg by mouth every 6 (six) hours as needed for mild pain.   Yes Historical Provider, MD  atorvastatin (LIPITOR) 20 MG tablet Take 20 mg by mouth at bedtime.    Yes Historical Provider, MD  citalopram (CELEXA)  20 MG tablet Take 20 mg by mouth at bedtime.   Yes Historical Provider, MD  latanoprost (XALATAN) 0.005 % ophthalmic solution Place 1 drop into the right eye at bedtime.    Yes Historical Provider, MD  LORazepam (ATIVAN) 0.5 MG tablet Take 0.5-1 mg by mouth 2 (two) times daily. Takes 0.5mg  in the morning and 1mg  at bedtime   Yes Historical Provider, MD  mirtazapine (REMERON) 30 MG tablet Take 30 mg by mouth at bedtime.   Yes Historical Provider, MD  Multiple Vitamin (MULTIVITAMIN WITH MINERALS) TABS tablet Take 1 tablet by mouth daily.   Yes Historical Provider, MD  QUEtiapine (SEROQUEL) 25 MG tablet Take 25 mg by mouth at bedtime.   Yes Historical Provider, MD   BP 129/67  Pulse 78  Temp(Src) 98.6 F (37 C) (Oral)  Resp 20  SpO2 96% Physical Exam  Nursing note and vitals reviewed. Constitutional: She appears well-developed and well-nourished. No distress.  HENT:  Head: Normocephalic and atraumatic.  Mouth/Throat: Oropharynx is clear and moist.  Eyes: Pupils are equal, round, and reactive to light.  Neck: Normal range of motion. Neck supple.  Cardiovascular: Normal rate, regular rhythm and normal heart sounds.  Exam reveals no gallop and no friction rub.   No murmur heard. Pulmonary/Chest: Effort normal and breath sounds normal. No respiratory distress.  Neurological: She is alert. She exhibits normal muscle tone. Coordination normal.  Skin: Skin is warm and dry.  No rash noted.    ED Course  Procedures (including critical care time) Labs Review Labs Reviewed  CBC WITH DIFFERENTIAL - Abnormal; Notable for the following:    Platelets 134 (*)    Neutrophils Relative % 85 (*)    Lymphocytes Relative 10 (*)    All other components within normal limits  COMPREHENSIVE METABOLIC PANEL - Abnormal; Notable for the following:    Glucose, Bld 150 (*)    Total Bilirubin 1.5 (*)    GFR calc non Af Amer 57 (*)    GFR calc Af Amer 66 (*)    All other components within normal limits   URINALYSIS, ROUTINE W REFLEX MICROSCOPIC - Abnormal; Notable for the following:    Leukocytes, UA SMALL (*)    All other components within normal limits  URINE CULTURE  URINE MICROSCOPIC-ADD ON    Imaging Review No results found.   EKG Interpretation   Date/Time:  Wednesday May 05 2014 09:16:22 EDT Ventricular Rate:  76 PR Interval:  159 QRS Duration: 92 QT Interval:  403 QTC Calculation: 453 R Axis:   -16 Text Interpretation:  Sinus rhythm Borderline left axis deviation no  significant change since 2013 Confirmed by GOLDSTON  MD, SCOTT ([id number]) on  05/05/2014 9:21:03 AM      I spoke with Dr. Selena BattenKim, who is the patient's primary care doctor.  He states, that he'll help in any way possible to help facilitate her into a long-term care situation.  Patient is not evident admittable diagnosis here today.  The patient is stable here in the emergency department.  I explained family the process, and they have spoken with an assisted-living facility they're familiar with     Carlyle DollyChristopher W Mikalia Fessel, PA-C 05/05/14 1336

## 2014-05-05 NOTE — ED Notes (Signed)
MD at bedside. 

## 2014-05-05 NOTE — ED Notes (Signed)
Per EMS. Pt from home. Son reports pt had doctor's appointment this am for bloodwork. Tried to get pt into car, but pt more agitated than usual. Hx of depression, on ativan at home. Pt reports nausea, but son told EMS pt commonly feels nauseated, but never throws up.

## 2014-05-05 NOTE — ED Notes (Signed)
PA at bedside.

## 2014-05-05 NOTE — ED Provider Notes (Signed)
Medical screening examination/treatment/procedure(s) were conducted as a shared visit with non-physician practitioner(s) and myself.  I personally evaluated the patient during the encounter.   EKG Interpretation   Date/Time:  Wednesday May 05 2014 09:16:22 EDT Ventricular Rate:  76 PR Interval:  159 QRS Duration: 92 QT Interval:  403 QTC Calculation: 453 R Axis:   -16 Text Interpretation:  Sinus rhythm Borderline left axis deviation no  significant change since 2013 Confirmed by GOLDSTON  MD, SCOTT ([id number]) on  05/05/2014 9:21:03 AM       Doug Sou, MD 05/05/14 2130

## 2014-05-06 LAB — URINE CULTURE

## 2014-05-25 ENCOUNTER — Emergency Department (HOSPITAL_COMMUNITY): Payer: Medicare Other

## 2014-05-25 ENCOUNTER — Emergency Department (HOSPITAL_COMMUNITY)
Admission: EM | Admit: 2014-05-25 | Discharge: 2014-05-27 | Disposition: A | Discharge status: Home / Self Care | Payer: Medicare Other | Attending: Emergency Medicine | Admitting: Emergency Medicine

## 2014-05-25 ENCOUNTER — Encounter (HOSPITAL_COMMUNITY): Payer: Self-pay | Admitting: Emergency Medicine

## 2014-05-25 DIAGNOSIS — F0390 Unspecified dementia without behavioral disturbance: Secondary | ICD-10-CM | POA: Insufficient documentation

## 2014-05-25 DIAGNOSIS — H409 Unspecified glaucoma: Secondary | ICD-10-CM | POA: Insufficient documentation

## 2014-05-25 DIAGNOSIS — R45850 Homicidal ideations: Secondary | ICD-10-CM | POA: Insufficient documentation

## 2014-05-25 DIAGNOSIS — Z79899 Other long term (current) drug therapy: Secondary | ICD-10-CM | POA: Insufficient documentation

## 2014-05-25 DIAGNOSIS — Z634 Disappearance and death of family member: Secondary | ICD-10-CM | POA: Insufficient documentation

## 2014-05-25 DIAGNOSIS — R45851 Suicidal ideations: Secondary | ICD-10-CM | POA: Insufficient documentation

## 2014-05-25 DIAGNOSIS — F332 Major depressive disorder, recurrent severe without psychotic features: Secondary | ICD-10-CM | POA: Insufficient documentation

## 2014-05-25 DIAGNOSIS — H02109 Unspecified ectropion of unspecified eye, unspecified eyelid: Secondary | ICD-10-CM | POA: Insufficient documentation

## 2014-05-25 LAB — URINALYSIS, ROUTINE W REFLEX MICROSCOPIC
Bilirubin Urine: NEGATIVE
Glucose, UA: NEGATIVE mg/dL
Hgb urine dipstick: NEGATIVE
KETONES UR: NEGATIVE mg/dL
NITRITE: NEGATIVE
PH: 6 (ref 5.0–8.0)
Protein, ur: NEGATIVE mg/dL
SPECIFIC GRAVITY, URINE: 1.005 (ref 1.005–1.030)
Urobilinogen, UA: 0.2 mg/dL (ref 0.0–1.0)

## 2014-05-25 LAB — COMPREHENSIVE METABOLIC PANEL
ALK PHOS: 89 U/L (ref 39–117)
ALT: 16 U/L (ref 0–35)
AST: 15 U/L (ref 0–37)
Albumin: 4 g/dL (ref 3.5–5.2)
BILIRUBIN TOTAL: 1.1 mg/dL (ref 0.3–1.2)
BUN: 16 mg/dL (ref 6–23)
CHLORIDE: 103 meq/L (ref 96–112)
CO2: 21 meq/L (ref 19–32)
CREATININE: 0.85 mg/dL (ref 0.50–1.10)
Calcium: 9.6 mg/dL (ref 8.4–10.5)
GFR, EST AFRICAN AMERICAN: 70 mL/min — AB (ref >90)
GFR, EST NON AFRICAN AMERICAN: 61 mL/min — AB (ref >90)
GLUCOSE: 103 mg/dL — AB (ref 70–99)
POTASSIUM: 4 meq/L (ref 3.7–5.3)
Sodium: 139 mEq/L (ref 137–147)
Total Protein: 7.1 g/dL (ref 6.0–8.3)

## 2014-05-25 LAB — ACETAMINOPHEN LEVEL

## 2014-05-25 LAB — CBC
HEMATOCRIT: 41.7 % (ref 36.0–46.0)
HEMOGLOBIN: 14.1 g/dL (ref 12.0–15.0)
MCH: 32.3 pg (ref 26.0–34.0)
MCHC: 33.8 g/dL (ref 30.0–36.0)
MCV: 95.4 fL (ref 78.0–100.0)
Platelets: 158 10*3/uL (ref 150–400)
RBC: 4.37 MIL/uL (ref 3.87–5.11)
RDW: 12.8 % (ref 11.5–15.5)
WBC: 8.2 10*3/uL (ref 4.0–10.5)

## 2014-05-25 LAB — RAPID URINE DRUG SCREEN, HOSP PERFORMED
Amphetamines: NOT DETECTED
BARBITURATES: NOT DETECTED
BENZODIAZEPINES: NOT DETECTED
Cocaine: NOT DETECTED
Opiates: NOT DETECTED
Tetrahydrocannabinol: NOT DETECTED

## 2014-05-25 LAB — ETHANOL

## 2014-05-25 LAB — URINE MICROSCOPIC-ADD ON

## 2014-05-25 LAB — SALICYLATE LEVEL: Salicylate Lvl: <2 mg/dL — ABNORMAL LOW (ref 2.8–20.0)

## 2014-05-25 MED ORDER — ACETAMINOPHEN 325 MG PO TABS
650.0000 mg | ORAL_TABLET | Freq: Four times a day (QID) | ORAL | Status: DC | PRN
  Administered 2014-05-25: 650 mg via ORAL
  Administered 2014-05-26: 325 mg via ORAL
  Administered 2014-05-27: 650 mg via ORAL
  Filled 2014-05-25 (×3): qty 2

## 2014-05-25 MED ORDER — LATANOPROST 0.005 % OP SOLN
1.0000 [drp] | Freq: Every day | OPHTHALMIC | Status: DC
  Administered 2014-05-25 – 2014-05-26 (×2): 1 [drp] via OPHTHALMIC
  Filled 2014-05-25 (×2): qty 2.5

## 2014-05-25 MED ORDER — ACETAMINOPHEN 325 MG PO TABS
ORAL_TABLET | ORAL | Status: AC
  Filled 2014-05-25: qty 2

## 2014-05-25 MED ORDER — VENLAFAXINE HCL ER 75 MG PO CP24
75.0000 mg | ORAL_CAPSULE | Freq: Every day | ORAL | Status: DC
  Administered 2014-05-26 – 2014-05-27 (×2): 75 mg via ORAL
  Filled 2014-05-25 (×3): qty 1

## 2014-05-25 MED ORDER — CEPHALEXIN 500 MG PO CAPS
ORAL_CAPSULE | ORAL | Status: AC
  Filled 2014-05-25: qty 1

## 2014-05-25 MED ORDER — CEPHALEXIN 500 MG PO CAPS
500.0000 mg | ORAL_CAPSULE | Freq: Three times a day (TID) | ORAL | Status: DC
  Administered 2014-05-25 – 2014-05-27 (×7): 500 mg via ORAL
  Filled 2014-05-25 (×6): qty 1

## 2014-05-25 MED ORDER — ATORVASTATIN CALCIUM 20 MG PO TABS
20.0000 mg | ORAL_TABLET | Freq: Every day | ORAL | Status: DC
  Administered 2014-05-25 – 2014-05-27 (×3): 20 mg via ORAL
  Filled 2014-05-25 (×5): qty 1

## 2014-05-25 MED ORDER — QUETIAPINE FUMARATE 25 MG PO TABS
25.0000 mg | ORAL_TABLET | Freq: Every day | ORAL | Status: DC
  Administered 2014-05-25 – 2014-05-26 (×2): 25 mg via ORAL
  Filled 2014-05-25 (×2): qty 1

## 2014-05-25 NOTE — ED Provider Notes (Signed)
CSN: [id number]     Arrival date & time 05/25/14  1138 History  This chart was scribed for non-physician practitioner, Coral Ceo, PA-C working with Juliet Rude. Rubin Payor, MD by Greggory Stallion, ED scribe. This patient was seen in room WTR4/WLPT4 and the patient's care was started at 1:45 PM.   Chief Complaint  Patient presents with  . Suicidal  . Homicidal    The history is provided by the patient and a relative. No language interpreter was used.    HPI Comments: [name] is a 80 y.o. female with history of underlying dementia, depression and glaucoma who presents to the Emergency Department for medical clearance. Daughter states her mother's mental state has worsnesd since pt's husband died 8 years ago. Patient has had periods of ups and downs over the past several years. Has had to be hospitalized for this in the past, but "gets better so we let her come back home." She has become increasingly more agitated over the past few months. States pt has been talking about killing herself. Patient denies any SI and states "I am fine." She put a knife to her chest earlier today but didn't actually do anything. Pt has called 911 daily for the last 3 days. Daughter states the pt threatened to kill her yesterday. Pt started taking Pristiq 25 mg about 2 weeks ago. No other recent medication changes. Pt sees a psychiatrist on a regular basis. Pt's son normally takes care of the pt but the daughter is currently doing it while he is out of town. Patient lives in a condo at home alone. Pt has been eating and drinking normally. Denies fever or any physical complaints. No chest pain, headache, abdominal pain, emesis. Pt has history of tuberculosis 30 years ago. Patient walks without assistive devices or difficulty.    Past Medical History  Diagnosis Date  . Depression   . Glaucoma    History reviewed. No pertinent past surgical history. History reviewed. No pertinent family history. History   Substance Use Topics  . Smoking status: Not on file  . Smokeless tobacco: Not on file  . Alcohol Use: Not on file   OB History   Grav Para Term Preterm Abortions TAB SAB Ect Mult Living                 Review of Systems  Constitutional: Negative for fever, chills, activity change, appetite change and fatigue.  HENT: Negative for congestion, rhinorrhea and sore throat.   Respiratory: Negative for cough and shortness of breath.   Cardiovascular: Negative for chest pain and leg swelling.  Genitourinary: Negative for dysuria and difficulty urinating.  Musculoskeletal: Negative for back pain, gait problem and myalgias.  Skin: Negative for rash.  Neurological: Negative for dizziness, tremors, syncope, facial asymmetry, weakness, light-headedness, numbness and headaches.  Psychiatric/Behavioral: Positive for suicidal ideas and behavioral problems. Negative for hallucinations, confusion, sleep disturbance and self-injury. The patient is not nervous/anxious.   All other systems reviewed and are negative.   Allergies  Review of patient's allergies indicates no known allergies.  Home Medications   Prior to Admission medications   Medication Sig Start Date End Date Taking? Authorizing Provider  acetaminophen (TYLENOL) 500 MG tablet Take 250 mg by mouth every 6 (six) hours as needed for mild pain.   Yes Historical Provider, MD  atorvastatin (LIPITOR) 20 MG tablet Take 20 mg by mouth at bedtime.    Yes Historical Provider, MD  Desvenlafaxine Succinate ER (PRISTIQ) 25 MG TB24 Take  25 mg by mouth 2 (two) times daily.   Yes Historical Provider, MD  latanoprost (XALATAN) 0.005 % ophthalmic solution Place 1 drop into the right eye at bedtime.    Yes Historical Provider, MD  LORazepam (ATIVAN) 0.5 MG tablet Take 0.5 mg by mouth 2 (two) times daily as needed for anxiety.    Yes Historical Provider, MD  Multiple Vitamin (MULTIVITAMIN WITH MINERALS) TABS tablet Take 1 tablet by mouth daily.   Yes  Historical Provider, MD  QUEtiapine (SEROQUEL) 25 MG tablet Take 25 mg by mouth at bedtime.   Yes Historical Provider, MD   BP 133/61  Pulse 90  Temp(Src) 98.4 F (36.9 C) (Oral)  Resp 16  SpO2 96%  Filed Vitals:   05/25/14 1938 05/26/14 0005 05/26/14 0428 05/26/14 0719  BP: 118/56 125/65 128/68 117/47  Pulse: 75 79 81 76  Temp: 98.4 F (36.9 C) 98.4 F (36.9 C)  97.8 F (36.6 C)  TempSrc: Oral Oral  Oral  Resp: 18 18 20 18   SpO2: 98% 95% 97% 94%    Physical Exam  Nursing note and vitals reviewed. Constitutional: She is oriented to person, place, and time. She appears well-developed and well-nourished. No distress.  Calm cooperative  HENT:  Head: Normocephalic and atraumatic.  Right Ear: External ear normal.  Left Ear: External ear normal.  Mouth/Throat: Oropharynx is clear and moist.  Eyes: Conjunctivae and EOM are normal. Pupils are equal, round, and reactive to light. Right eye exhibits no discharge. Left eye exhibits no discharge.  Ectropion on the right eye   Neck: Normal range of motion. Neck supple. No tracheal deviation present.  Cardiovascular: Normal rate, regular rhythm, normal heart sounds and intact distal pulses.  Exam reveals no gallop and no friction rub.   No murmur heard. Pulmonary/Chest: Effort normal and breath sounds normal. No respiratory distress. She has no wheezes. She has no rales.  No wounds to the chest throughout  Abdominal: Soft. Bowel sounds are normal. There is no tenderness.  Musculoskeletal: Normal range of motion. She exhibits no edema and no tenderness.  No LE edema or calf tenderness bilaterally. Patient moving all extremities. Patient able to ambulate without difficulty or ataxia.   Neurological: She is alert and oriented to person, place, and time. No cranial nerve deficit.  GCS 15. No focal neurological deficits. CN 2-12 intact.   Skin: Skin is warm and dry. She is not diaphoretic.  Psychiatric: She has a normal mood and affect. Her  behavior is normal.    ED Course  Procedures (including critical care time)  DIAGNOSTIC STUDIES: Oxygen Saturation is 96% on RA, normal by my interpretation.    COORDINATION OF CARE: 1:52 PM-Discussed treatment plan which includes speaking with behavioral health specialist with pt and her daughter at bedside and they agreed to plan.   Labs Review Labs Reviewed  COMPREHENSIVE METABOLIC PANEL - Abnormal; Notable for the following:    Glucose, Bld 103 (*)    GFR calc non Af Amer 61 (*)    GFR calc Af Amer 70 (*)    All other components within normal limits  SALICYLATE LEVEL - Abnormal; Notable for the following:    Salicylate Lvl <2.0 (*)    All other components within normal limits  URINALYSIS, ROUTINE W REFLEX MICROSCOPIC - Abnormal; Notable for the following:    Leukocytes, UA SMALL (*)    All other components within normal limits  URINE MICROSCOPIC-ADD ON - Abnormal; Notable for the following:  Bacteria, UA FEW (*)    All other components within normal limits  ACETAMINOPHEN LEVEL  CBC  ETHANOL  URINE RAPID DRUG SCREEN (HOSP PERFORMED)    Imaging Review Dg Chest 2 View  05/25/2014   CLINICAL DATA:  Chest pain  EXAM: CHEST  2 VIEW  COMPARISON:  10/16/2010  FINDINGS: Heart size and vascular pattern are normal. Lungs are clear. Chronic aortic arch calcification. No pleural effusion. Significant compression deformity at the thoracolumbar junction is new from 2011.  IMPRESSION: 50% compression fracture of a thoracolumbar vertebral body age uncertain new from 2011.   Electronically Signed   By: Esperanza Heiraymond  Rubner M.D.   On: 05/25/2014 14:27     EKG Interpretation None      Results for orders placed during the hospital encounter of 05/25/14  ACETAMINOPHEN LEVEL      Result Value Ref Range   Acetaminophen (Tylenol), Serum <15.0  10 - 30 ug/mL  CBC      Result Value Ref Range   WBC 8.2  4.0 - 10.5 K/uL   RBC 4.37  3.87 - 5.11 MIL/uL   Hemoglobin 14.1  12.0 - 15.0 g/dL   HCT  82.941.7  56.236.0 - 13.046.0 %   MCV 95.4  78.0 - 100.0 fL   MCH 32.3  26.0 - 34.0 pg   MCHC 33.8  30.0 - 36.0 g/dL   RDW 86.512.8  78.411.5 - 69.615.5 %   Platelets 158  150 - 400 K/uL  COMPREHENSIVE METABOLIC PANEL      Result Value Ref Range   Sodium 139  137 - 147 mEq/L   Potassium 4.0  3.7 - 5.3 mEq/L   Chloride 103  96 - 112 mEq/L   CO2 21  19 - 32 mEq/L   Glucose, Bld 103 (*) 70 - 99 mg/dL   BUN 16  6 - 23 mg/dL   Creatinine, Ser 2.950.85  0.50 - 1.10 mg/dL   Calcium 9.6  8.4 - 28.410.5 mg/dL   Total Protein 7.1  6.0 - 8.3 g/dL   Albumin 4.0  3.5 - 5.2 g/dL   AST 15  0 - 37 U/L   ALT 16  0 - 35 U/L   Alkaline Phosphatase 89  39 - 117 U/L   Total Bilirubin 1.1  0.3 - 1.2 mg/dL   GFR calc non Af Amer 61 (*) >90 mL/min   GFR calc Af Amer 70 (*) >90 mL/min  ETHANOL      Result Value Ref Range   Alcohol, Ethyl (B) <11  0 - 11 mg/dL  SALICYLATE LEVEL      Result Value Ref Range   Salicylate Lvl <2.0 (*) 2.8 - 20.0 mg/dL  URINE RAPID DRUG SCREEN (HOSP PERFORMED)      Result Value Ref Range   Opiates NONE DETECTED  NONE DETECTED   Cocaine NONE DETECTED  NONE DETECTED   Benzodiazepines NONE DETECTED  NONE DETECTED   Amphetamines NONE DETECTED  NONE DETECTED   Tetrahydrocannabinol NONE DETECTED  NONE DETECTED   Barbiturates NONE DETECTED  NONE DETECTED  URINALYSIS, ROUTINE W REFLEX MICROSCOPIC      Result Value Ref Range   Color, Urine YELLOW  YELLOW   APPearance CLEAR  CLEAR   Specific Gravity, Urine 1.005  1.005 - 1.030   pH 6.0  5.0 - 8.0   Glucose, UA NEGATIVE  NEGATIVE mg/dL   Hgb urine dipstick NEGATIVE  NEGATIVE   Bilirubin Urine NEGATIVE  NEGATIVE   Ketones, ur  NEGATIVE  NEGATIVE mg/dL   Protein, ur NEGATIVE  NEGATIVE mg/dL   Urobilinogen, UA 0.2  0.0 - 1.0 mg/dL   Nitrite NEGATIVE  NEGATIVE   Leukocytes, UA SMALL (*) NEGATIVE  URINE MICROSCOPIC-ADD ON      Result Value Ref Range   Squamous Epithelial / LPF RARE  RARE   WBC, UA 3-6  <3 WBC/hpf   Bacteria, UA FEW (*) RARE    MDM    [name] is a 80 y.o. female with history of underlying dementia, depression and glaucoma who presents to the Emergency Department for medical clearance. Patient medically cleared. No acute reported changes in behavior. Patient has had increased agitation for several months similar to her episodes in the past. No neurological deficits on exam. UA not highly suggestive of a UTI. Sent for culture. EKG and chest x-ray done for medical clearance. Patient asymptomatic. Chest x-rays shows a compression fx however this is unlikely an acute finding. No hx of recent falls. No complaints of back pain. EKG negative for any acute ischemic changes. Labs unremarkable. Vital signs stable. Per BH, admit to inpatient gero-psychiatry for stabilization.    Final impressions: 1. Major depressive disorder, recurrent, severe without psychotic features   2. Bereavement   3. Suicidal ideations       Luiz IronJessica Katlin Palmer PA-C   This patient was discussed with Dr. Rubin PayorPickering   I personally performed the services described in this documentation, which was scribed in my presence. The recorded information has been reviewed and is accurate.  Jillyn LedgerJessica K Palmer, PA-C 05/26/14 [phone]

## 2014-05-25 NOTE — ED Notes (Signed)
Family has patient's belongings. 

## 2014-05-25 NOTE — ED Notes (Signed)
MD at bedside. TTS PRESENT TO EVALUATE THIS PT 

## 2014-05-25 NOTE — ED Notes (Signed)
Bed: WA21 Expected date:  Expected time:  Means of arrival:  Comments: TCU patient

## 2014-05-25 NOTE — Consult Note (Signed)
[facility] Face-to-Face Psychiatry Consult   Reason for Consult:  Depression with suicidal ideations Referring Physician:  EDP  [name] is an 80 y.o. female. Total Time spent with patient: 20 minutes  Assessment: AXIS I:  Bereavement and Major Depression, Recurrent severe AXIS II:  Deferred AXIS III:   Past Medical History  Diagnosis Date  . Depression   . Glaucoma    AXIS IV:  other psychosocial or environmental problems, problems related to social environment and problems with primary support group AXIS V:  21-30 behavior considerably influenced by delusions or hallucinations OR serious impairment in judgment, communication OR inability to function in almost all areas  Plan:  Recommend psychiatric Inpatient admission when medically cleared. Dr. Lovena Le assessed the patient and concurs with the plan.  Subjective:   [name] is a 80 y.o. female patient admitted with depression with suicidal ideations. HPI:  Patient has been depressed since her husband died 8 years ago.  It has increased with her dementia over the past few weeks with her antidepressants not working.  Denies hallucinations and homicidal ideations.  HEr daughter is at her bedside and states her mother states that she wants to die everyday, not happy, and memory issues getting worse.  [name] has called 911 3 times over the past 3 days but does not remember doing this.  Her son comes and takes care of her medications and she has been calling him 5-6 times a day while he is working.  Her daughter brought her to the ED today after she took a knife and held it to her chest because she wanted to die.  The patient minimizes her memory loss and actions.  They tried assisted living in the past but their mother wanted to return to her 89 where she lives by herself. HPI Elements:   Location:  generalized. Quality:  acute. Severity:  severe. Timing:  intermittent. Duration:  few weeks. Context:   stressors.  Past Psychiatric History: Past Medical History  Diagnosis Date  . Depression   . Glaucoma     reports that she has never smoked. She has never used smokeless tobacco. Her alcohol and drug histories are not on file. History reviewed. No pertinent family history.         Allergies:  No Known Allergies  ACT Assessment Complete:  Yes:    Educational Status    Risk to Self: Risk to self Is patient at risk for suicide?: Yes  Risk to Others:    Abuse:    Prior Inpatient Therapy:    Prior Outpatient Therapy:    Additional Information:                    Objective: Blood pressure 142/58, pulse 78, temperature 98.4 F (36.9 C), temperature source Oral, resp. rate 16, SpO2 96.00%.There is no height or weight on file to calculate BMI. Results for orders placed during the hospital encounter of 05/25/14 (from the past 72 hour(s))  ACETAMINOPHEN LEVEL     Status: None   Collection Time    05/25/14 12:02 PM      Result Value Ref Range   Acetaminophen (Tylenol), Serum <15.0  10 - 30 ug/mL   Comment:            THERAPEUTIC CONCENTRATIONS VARY     SIGNIFICANTLY. A RANGE OF 10-30     ug/mL MAY BE AN EFFECTIVE     CONCENTRATION FOR MANY PATIENTS.     HOWEVER, SOME  ARE BEST TREATED     AT CONCENTRATIONS OUTSIDE THIS     RANGE.     ACETAMINOPHEN CONCENTRATIONS     >150 ug/mL AT 4 HOURS AFTER     INGESTION AND >50 ug/mL AT 12     HOURS AFTER INGESTION ARE     OFTEN ASSOCIATED WITH TOXIC     REACTIONS.  CBC     Status: None   Collection Time    05/25/14 12:02 PM      Result Value Ref Range   WBC 8.2  4.0 - 10.5 K/uL   RBC 4.37  3.87 - 5.11 MIL/uL   Hemoglobin 14.1  12.0 - 15.0 g/dL   HCT 41.7  36.0 - 46.0 %   MCV 95.4  78.0 - 100.0 fL   MCH 32.3  26.0 - 34.0 pg   MCHC 33.8  30.0 - 36.0 g/dL   RDW 12.8  11.5 - 15.5 %   Platelets 158  150 - 400 K/uL  COMPREHENSIVE METABOLIC PANEL     Status: Abnormal   Collection Time    05/25/14 12:02 PM      Result  Value Ref Range   Sodium 139  137 - 147 mEq/L   Potassium 4.0  3.7 - 5.3 mEq/L   Chloride 103  96 - 112 mEq/L   CO2 21  19 - 32 mEq/L   Glucose, Bld 103 (*) 70 - 99 mg/dL   BUN 16  6 - 23 mg/dL   Creatinine, Ser 0.85  0.50 - 1.10 mg/dL   Calcium 9.6  8.4 - 10.5 mg/dL   Total Protein 7.1  6.0 - 8.3 g/dL   Albumin 4.0  3.5 - 5.2 g/dL   AST 15  0 - 37 U/L   ALT 16  0 - 35 U/L   Alkaline Phosphatase 89  39 - 117 U/L   Total Bilirubin 1.1  0.3 - 1.2 mg/dL   GFR calc non Af Amer 61 (*) >90 mL/min   GFR calc Af Amer 70 (*) >90 mL/min   Comment: (NOTE)     The eGFR has been calculated using the CKD EPI equation.     This calculation has not been validated in all clinical situations.     eGFR's persistently <90 mL/min signify possible Chronic Kidney     Disease.  ETHANOL     Status: None   Collection Time    05/25/14 12:02 PM      Result Value Ref Range   Alcohol, Ethyl (B) <11  0 - 11 mg/dL   Comment:            LOWEST DETECTABLE LIMIT FOR     SERUM ALCOHOL IS 11 mg/dL     FOR MEDICAL PURPOSES ONLY  SALICYLATE LEVEL     Status: Abnormal   Collection Time    05/25/14 12:02 PM      Result Value Ref Range   Salicylate Lvl <4.0 (*) 2.8 - 20.0 mg/dL  URINE RAPID DRUG SCREEN (HOSP PERFORMED)     Status: None   Collection Time    05/25/14 12:25 PM      Result Value Ref Range   Opiates NONE DETECTED  NONE DETECTED   Cocaine NONE DETECTED  NONE DETECTED   Benzodiazepines NONE DETECTED  NONE DETECTED   Amphetamines NONE DETECTED  NONE DETECTED   Tetrahydrocannabinol NONE DETECTED  NONE DETECTED   Barbiturates NONE DETECTED  NONE DETECTED   Comment:  DRUG SCREEN FOR MEDICAL PURPOSES     ONLY.  IF CONFIRMATION IS NEEDED     FOR ANY PURPOSE, NOTIFY LAB     WITHIN 5 DAYS.                LOWEST DETECTABLE LIMITS     FOR URINE DRUG SCREEN     Drug Class       Cutoff (ng/mL)     Amphetamine      1000     Barbiturate      200     Benzodiazepine   562     Tricyclics       130      Opiates          300     Cocaine          300     THC              50  URINALYSIS, ROUTINE W REFLEX MICROSCOPIC     Status: Abnormal   Collection Time    05/25/14 12:25 PM      Result Value Ref Range   Color, Urine YELLOW  YELLOW   APPearance CLEAR  CLEAR   Specific Gravity, Urine 1.005  1.005 - 1.030   pH 6.0  5.0 - 8.0   Glucose, UA NEGATIVE  NEGATIVE mg/dL   Hgb urine dipstick NEGATIVE  NEGATIVE   Bilirubin Urine NEGATIVE  NEGATIVE   Ketones, ur NEGATIVE  NEGATIVE mg/dL   Protein, ur NEGATIVE  NEGATIVE mg/dL   Urobilinogen, UA 0.2  0.0 - 1.0 mg/dL   Nitrite NEGATIVE  NEGATIVE   Leukocytes, UA SMALL (*) NEGATIVE  URINE MICROSCOPIC-ADD ON     Status: Abnormal   Collection Time    05/25/14 12:25 PM      Result Value Ref Range   Squamous Epithelial / LPF RARE  RARE   WBC, UA 3-6  <3 WBC/hpf   Bacteria, UA FEW (*) RARE   Labs are reviewed and are pertinent for UTI, medication ordered..  No current facility-administered medications for this encounter.   Current Outpatient Prescriptions  Medication Sig Dispense Refill  . acetaminophen (TYLENOL) 500 MG tablet Take 250 mg by mouth every 6 (six) hours as needed for mild pain.      Marland Kitchen atorvastatin (LIPITOR) 20 MG tablet Take 20 mg by mouth at bedtime.       Marland Kitchen Desvenlafaxine Succinate ER (PRISTIQ) 25 MG TB24 Take 25 mg by mouth 2 (two) times daily.      Marland Kitchen latanoprost (XALATAN) 0.005 % ophthalmic solution Place 1 drop into the right eye at bedtime.       Marland Kitchen LORazepam (ATIVAN) 0.5 MG tablet Take 0.5 mg by mouth 2 (two) times daily as needed for anxiety.       . Multiple Vitamin (MULTIVITAMIN WITH MINERALS) TABS tablet Take 1 tablet by mouth daily.      . QUEtiapine (SEROQUEL) 25 MG tablet Take 25 mg by mouth at bedtime.        Psychiatric Specialty Exam:     Blood pressure 142/58, pulse 78, temperature 98.4 F (36.9 C), temperature source Oral, resp. rate 16, SpO2 96.00%.There is no height or weight on file to calculate BMI.   General Appearance: Disheveled  Eye Sport and exercise psychologist::  Fair  Speech:  Normal Rate  Volume:  Normal  Mood:  Anxious and Irritable  Affect:  Congruent  Thought Process:  Coherent  Orientation:  Does not know the date  but does know the place and person  Thought Content:  Paranoid Ideation and Rumination  Suicidal Thoughts:  Yes.  with intent/plan  Homicidal Thoughts:  No  Memory:  Immediate;   Fair Recent;   Poor Remote;   Fair  Judgement:  Impaired  Insight:  Lacking  Psychomotor Activity:  Normal  Concentration:  Fair  Recall:  Poor  Fund of Knowledge:Fair  Language: Fair  Akathisia:  No  Handed:  Right  AIMS (if indicated):     Assets:  Financial Resources/Insurance Housing Resilience Social Support  Sleep:      Musculoskeletal: Strength & Muscle Tone: within normal limits Gait & Station: normal Patient leans: N/A  Treatment Plan Summary: Daily contact with patient to assess and evaluate symptoms and progress in treatment Medication management; admit to inpatient gero-psychiatry for stabilization.  Waylan Boga, [place] 05/25/2014 4:19 PM

## 2014-05-25 NOTE — ED Notes (Signed)
Bed: [id number] Expected date:  Expected time:  Means of arrival:  Comments: Triage 4, [place]

## 2014-05-25 NOTE — ED Notes (Signed)
Pt daughter reports the last week has been very bad. Pt has been talking about killing herself. For the last three days pt has called 911 daily. Yesterday pt threatened to kill her daughter. Today pt put a knife to her chest and said she was going to kill herself. Daughter after a while got the knife away from her mother. Daughter reports pt can put on a good front to strangers and act like everything is normal, but then at home acts like pt "is losing her mind". pts husband died 8 years ago and pt has never been the same since then. Pt denies pain.

## 2014-05-26 LAB — URINE CULTURE

## 2014-05-26 MED ORDER — LORAZEPAM 1 MG PO TABS
1.0000 mg | ORAL_TABLET | Freq: Four times a day (QID) | ORAL | Status: DC | PRN
  Administered 2014-05-26 – 2014-05-27 (×4): 1 mg via ORAL
  Filled 2014-05-26 (×4): qty 1

## 2014-05-26 MED ORDER — ONDANSETRON 8 MG PO TBDP
8.0000 mg | ORAL_TABLET | Freq: Once | ORAL | Status: AC
Start: 2014-05-26 — End: 2014-05-26
  Administered 2014-05-26: 8 mg via ORAL
  Filled 2014-05-26: qty 1

## 2014-05-26 MED ORDER — LORAZEPAM 1 MG PO TABS
1.0000 mg | ORAL_TABLET | Freq: Once | ORAL | Status: AC
  Administered 2014-05-26: 1 mg via ORAL
  Filled 2014-05-26: qty 1

## 2014-05-26 NOTE — ED Notes (Signed)
Pt making her bed currently. Daughter at bedside. Pt calm and cooperative at this time. Food and drink offered to patient but patient states "I'll just wait until supper."

## 2014-05-26 NOTE — ED Notes (Signed)
Pt complaining of being nauseated, zofran given

## 2014-05-26 NOTE — ED Notes (Signed)
Daughter concerned about meal tray being late. Once again offered patient a sandwich and or snack until tray arrives but offer is refused. Cafeteria contacted about tardiness of meal trays.

## 2014-05-26 NOTE — ED Notes (Addendum)
Charge Nurse brought in pt dinner tray. Pt dinner has arrived now daughter was present at the time it arrived.

## 2014-05-26 NOTE — ED Notes (Signed)
Pt sleeping. Rise and fall of chest. Sitter at bedside. 

## 2014-05-26 NOTE — Progress Notes (Signed)
CSW spoke with patient daughter and provider her [place].  It was said that the patient's son/POA is still out of town and not available at this time.  Patient became increasingly agitated as the daughter spoke with staff because she believes the daughter is speaking against her therefore staff provided support then left the room.     8:30pm- CSW spoke with Annabelle Harmanana with [facility] she is still reviewing her referral and will call back.    Maryelizabeth Rowanressa Corbett, MSW, [place], 05/26/2014 Evening Clinical Social Worker [phone]

## 2014-05-26 NOTE — ED Notes (Signed)
Pt ambulated to restroom with sitter. 

## 2014-05-26 NOTE — ED Notes (Signed)
Pt has finish her dinner.

## 2014-05-26 NOTE — ED Notes (Signed)
Patient's son Link Snuffer(Eddie) called and stated he would like to have patient stay in the ED until he arrives back in town this  Afternoon. Patient's son also stated that he would like patient placed at [facility] if possible. Margarette Asalddie Gomillion/[phone]

## 2014-05-26 NOTE — Consult Note (Signed)
  Psychiatric Specialty Exam: Physical Exam  ROS  Blood pressure 123/62, pulse 76, temperature 98.4 F (36.9 C), temperature source Oral, resp. rate 20, SpO2 96.00%.There is no height or weight on file to calculate BMI.  General Appearance: Casual  Eye Contact::  Good  Speech:  Clear and Coherent  Volume:  Normal  Mood:  Irritable  Affect:  Appropriate  Thought Process:  Coherent  Orientation:  Other:  oriented to place and self but not to date  Thought Content:  Negative  Suicidal Thoughts:  No  Homicidal Thoughts:  No  Memory:  Immediate;   Poor Recent;   Poor Remote;   Poor  Judgement:  Impaired  Insight:  Lacking  Psychomotor Activity:  Normal  Concentration:  Good  Recall:  Poor  Akathisia:  Negative  Handed:  Right  AIMS (if indicated):     Assets:  Financial Resources/Insurance Housing Social Support  Sleep:   good  [name] remains the same.  Denies any threats to anyone and wants to go home.  Her daughter was very clear about her repeated 911 calls and daily suicidal threats as well as her having a knife to her chest the day of admission threatening to kill herself.  The plan is to continue seeking inpatient geriatric bed.

## 2014-05-26 NOTE — ED Notes (Signed)
Pt daughter concerned about medications that are given at night. She would like to speak with the psychiatrist about the medications. Daughter concerned about dinner being late. Snacks offered multiple times to patient but patient would like to wait for dinner.

## 2014-05-26 NOTE — ED Notes (Signed)
15 minutes has pt daughter say her mom is still waiting on her dinner

## 2014-05-26 NOTE — ED Notes (Signed)
Pt requested tylenol but now states that her headache is better.

## 2014-05-27 DIAGNOSIS — F3289 Other specified depressive episodes: Secondary | ICD-10-CM

## 2014-05-27 DIAGNOSIS — R45851 Suicidal ideations: Secondary | ICD-10-CM

## 2014-05-27 DIAGNOSIS — F329 Major depressive disorder, single episode, unspecified: Secondary | ICD-10-CM

## 2014-05-27 NOTE — ED Provider Notes (Signed)
Medical screening examination/treatment/procedure(s) were conducted as a shared visit with non-physician practitioner(s) and myself.  I personally evaluated the patient during the encounter.   EKG Interpretation   Date/Time:  Tuesday May 25 2014 14:54:30 EDT Ventricular Rate:  77 PR Interval:  161 QRS Duration: 94 QT Interval:  393 QTC Calculation: 445 R Axis:   -11 Text Interpretation:  Sinus rhythm Baseline wander in lead(s) V2 V6 ED  PHYSICIAN INTERPRETATION AVAILABLE IN [facility] Confirmed by TEST,  Record ([id number]) on 05/27/2014 7:01:51 AM     Patient with altered mental status. Has been talking about killing herself. Urine culture sent. Will attempt placement  Nathan R. Rubin PayorPickering, MD 05/27/14 1450

## 2014-05-27 NOTE — Progress Notes (Signed)
CSW spoke with Dana with [facility] accepting this patient per Dr. Dunham. Report# [phone].  The facility is ready for the patient to be transported at this time.  CSW informed nursing staff of the updated disposition.  CSW called the sheriff department spoke with Sargent Pascal and scheduled transportation.  He reports as soon as he can get an Officer available they will call [phone] to speak with Rachel, RN patient's nurse.     Tressa Corbett, MSW, LCSWA, 05/27/2014 Evening Clinical Social Worker [phone]  

## 2014-05-27 NOTE — ED Notes (Signed)
Social work at bedside.  

## 2014-05-27 NOTE — ED Notes (Signed)
Psychiatry at bedside.

## 2014-05-27 NOTE — Progress Notes (Signed)
CSW spoke with the family about the placement process and transportation process when a patient is under Involuntary commitment.  Patient's daughter became upset and very tearful blaming herself for her mother's current despair.  CSW escorted the daughter in the ED family room for privacy.  CSW provided the daughter with supportive listening and rational outcomes if she did not seek treatment for her mother when she did.  CSW informed the patient that she will not be able to be in attendance [facility] like she was her in the ED.  She verbalized her understanding that she would need to let go and allow her mother to get the treatment needed.  Daughter expressed her appreciation for the support from the social work department.    CSW informed the family that [facility] and they can follow.  CSW spoke with the son to make sure he had the ALF resources given to his sister the other day. He reported that they are going to return the patient back to [facility] once she [facility].  He states that she appeared to be very happy living in that community and he just want to get her back there because when she get back home thing get worse.  The [facility].     Maryelizabeth Rowanressa Corbett, MSW, PostLCSWA, 05/27/2014 Evening Clinical Social Worker [phone]

## 2014-05-27 NOTE — BHH Counselor (Signed)
TC to [name] admitting RN at [facility]. She states that pt does have a bed at their facility. She sts that she is waiting for a patient to be discharged from [facility] reserved bed. [name] says she will call TTS/CSW when bed is available. [name] reports most discharges occur by mid afternoon.   Nellie Chevalier Paige Jacquelynne Guedes, LCSWA Assessment Counselor  

## 2014-05-27 NOTE — ED Notes (Addendum)
Annabelle HarmanDana from [facility] reports they are awaiting discharges in the morning for female beds. She also reports they will call our social worker and arrange transport for patient.

## 2014-05-27 NOTE — Consult Note (Signed)
  Psychiatric Specialty Exam: Physical Exam  ROS  Blood pressure 133/59, pulse 85, temperature 98.4 F (36.9 C), temperature source Axillary, resp. rate 16, SpO2 94.00%.There is no height or weight on file to calculate BMI.  General Appearance: Fairly Groomed  Patent attorneyye Contact::  Good  Speech:  Clear and Coherent  Volume:  Normal  Mood:  Anxious  Affect:  Appropriate  Thought Process:  Coherent  Orientation:  Other:  self and place  Thought Content:  Negative  Suicidal Thoughts:  No  Homicidal Thoughts:  No  Memory:  Immediate;   Fair Recent;   Poor Remote;   Poor  Judgement:  Poor  Insight:  Lacking  Psychomotor Activity:  Normal  Concentration:  Fair  Recall:  Poor  Akathisia:  Negative  Handed:  Right  AIMS (if indicated):   0  Assets:  Physical Health Social Support  Sleep:    good  [name] remains the same.  I talked to her son and daughter this am.  They say she has been in assisted living 5 times and does well but at home she gets depressed and makes suicidal threats daily.  She denies all of this and just wants to go home.  [facility] when a [facility].

## 2014-05-27 NOTE — ED Notes (Signed)
Daughter complained about about her mother too little scrub top.  Pt.was offered a large and medium scrub top and she tried on a large top and said it was too big. she put back on the small scrub top she had on before said it was a better fit for her.

## 2014-05-27 NOTE — ED Notes (Signed)
Pt ambulated to restroom. 

## 2014-05-27 NOTE — Consult Note (Signed)
Face to face evaluation and I agree with this note 

## 2014-06-02 ENCOUNTER — Institutional Professional Consult (permissible substitution): Payer: Medicare Other | Admitting: Internal Medicine

## 2014-06-29 ENCOUNTER — Emergency Department (HOSPITAL_COMMUNITY): Payer: Medicare Other

## 2014-06-29 ENCOUNTER — Encounter (HOSPITAL_COMMUNITY): Payer: Self-pay | Admitting: Emergency Medicine

## 2014-06-29 ENCOUNTER — Emergency Department (HOSPITAL_COMMUNITY)
Admission: EM | Admit: 2014-06-29 | Discharge: 2014-06-30 | Disposition: A | Discharge status: Home / Self Care | Payer: Medicare Other | Attending: Emergency Medicine | Admitting: Emergency Medicine

## 2014-06-29 DIAGNOSIS — F0390 Unspecified dementia without behavioral disturbance: Secondary | ICD-10-CM | POA: Insufficient documentation

## 2014-06-29 DIAGNOSIS — Z79899 Other long term (current) drug therapy: Secondary | ICD-10-CM | POA: Insufficient documentation

## 2014-06-29 DIAGNOSIS — H409 Unspecified glaucoma: Secondary | ICD-10-CM | POA: Insufficient documentation

## 2014-06-29 DIAGNOSIS — F329 Major depressive disorder, single episode, unspecified: Secondary | ICD-10-CM | POA: Insufficient documentation

## 2014-06-29 DIAGNOSIS — R443 Hallucinations, unspecified: Secondary | ICD-10-CM | POA: Diagnosis present

## 2014-06-29 DIAGNOSIS — F411 Generalized anxiety disorder: Secondary | ICD-10-CM | POA: Diagnosis not present

## 2014-06-29 DIAGNOSIS — F3289 Other specified depressive episodes: Secondary | ICD-10-CM | POA: Diagnosis not present

## 2014-06-29 DIAGNOSIS — N390 Urinary tract infection, site not specified: Secondary | ICD-10-CM | POA: Insufficient documentation

## 2014-06-29 LAB — CBC WITH DIFFERENTIAL/PLATELET
BASOS PCT: 0 % (ref 0–1)
Basophils Absolute: 0 10*3/uL (ref 0.0–0.1)
EOS ABS: 0.1 10*3/uL (ref 0.0–0.7)
Eosinophils Relative: 1 % (ref 0–5)
HCT: 38 % (ref 36.0–46.0)
Hemoglobin: 12.7 g/dL (ref 12.0–15.0)
LYMPHS ABS: 1.3 10*3/uL (ref 0.7–4.0)
Lymphocytes Relative: 14 % (ref 12–46)
MCH: 32.2 pg (ref 26.0–34.0)
MCHC: 33.4 g/dL (ref 30.0–36.0)
MCV: 96.2 fL (ref 78.0–100.0)
MONOS PCT: 10 % (ref 3–12)
Monocytes Absolute: 0.9 10*3/uL (ref 0.1–1.0)
Neutro Abs: 7.2 10*3/uL (ref 1.7–7.7)
Neutrophils Relative %: 75 % (ref 43–77)
Platelets: 174 10*3/uL (ref 150–400)
RBC: 3.95 MIL/uL (ref 3.87–5.11)
RDW: 13.2 % (ref 11.5–15.5)
WBC: 9.6 10*3/uL (ref 4.0–10.5)

## 2014-06-29 LAB — COMPREHENSIVE METABOLIC PANEL
ALT: 18 U/L (ref 0–35)
ANION GAP: 13 (ref 5–15)
AST: 15 U/L (ref 0–37)
Albumin: 3.3 g/dL — ABNORMAL LOW (ref 3.5–5.2)
Alkaline Phosphatase: 104 U/L (ref 39–117)
BUN: 20 mg/dL (ref 6–23)
CALCIUM: 9.7 mg/dL (ref 8.4–10.5)
CO2: 22 mEq/L (ref 19–32)
Chloride: 104 mEq/L (ref 96–112)
Creatinine, Ser: 0.79 mg/dL (ref 0.50–1.10)
GFR calc non Af Amer: 74 mL/min — ABNORMAL LOW (ref >90)
GFR, EST AFRICAN AMERICAN: 86 mL/min — AB (ref >90)
GLUCOSE: 116 mg/dL — AB (ref 70–99)
Potassium: 4.1 mEq/L (ref 3.7–5.3)
Sodium: 139 mEq/L (ref 137–147)
TOTAL PROTEIN: 7.2 g/dL (ref 6.0–8.3)
Total Bilirubin: 0.7 mg/dL (ref 0.3–1.2)

## 2014-06-29 LAB — URINALYSIS, ROUTINE W REFLEX MICROSCOPIC
Bilirubin Urine: NEGATIVE
Glucose, UA: NEGATIVE mg/dL
Ketones, ur: NEGATIVE mg/dL
NITRITE: POSITIVE — AB
Protein, ur: NEGATIVE mg/dL
Specific Gravity, Urine: 1.014 (ref 1.005–1.030)
UROBILINOGEN UA: 0.2 mg/dL (ref 0.0–1.0)
pH: 6 (ref 5.0–8.0)

## 2014-06-29 LAB — URINE MICROSCOPIC-ADD ON

## 2014-06-29 LAB — TROPONIN I: Troponin I: <0.3 ng/mL (ref <0.30)

## 2014-06-29 MED ORDER — CEPHALEXIN 500 MG PO CAPS
500.0000 mg | ORAL_CAPSULE | Freq: Once | ORAL | Status: AC
  Administered 2014-06-29: 500 mg via ORAL
  Filled 2014-06-29: qty 1

## 2014-06-29 MED ORDER — CEPHALEXIN 500 MG PO CAPS: 500.0000 mg | ORAL_CAPSULE | Freq: Four times a day (QID) | ORAL | Status: DC

## 2014-06-29 NOTE — ED Notes (Signed)
[facility] [phone]

## 2014-06-29 NOTE — ED Notes (Signed)
Bed: [id number] Expected date:  Expected time:  Means of arrival:  Comments: EMS 80yo F, hallucinations, anxiety, dementia

## 2014-06-29 NOTE — ED Notes (Signed)
Per EMS patient was brought in due to her son calling EMS because she was hallucinating. Son usually could calm patient down but he could not this time.

## 2014-06-29 NOTE — ED Provider Notes (Signed)
CSN: [id number]     Arrival date & time 06/29/14  2002 History   First MD Initiated Contact with Patient 06/29/14 2020     Chief Complaint  Patient presents with  . Hallucinations     (Consider location/radiation/quality/duration/timing/severity/associated sxs/prior Treatment) HPI Comments: Patient presents by EMS because her son states she was hallucinating. He says she would not calm down. Here she is calm and cooperative and denies complaint. She has a history of dementia, anxiety, previous hallucinations. She has been hospitalized for suicidal thoughts in the past. She denies any of this now. Denies hallucinations, suicidal or homicidal thoughts. She denies any headache, chest pain abdominal pain, nausea or vomiting. No focal weakness numbness or tingling. She's been eating and drinking well.  The history is provided by the patient, the EMS personnel and a relative. The history is limited by the condition of the patient.    Past Medical History  Diagnosis Date  . Depression   . Glaucoma    History reviewed. No pertinent past surgical history. History reviewed. No pertinent family history. History  Substance Use Topics  . Smoking status: Never Smoker   . Smokeless tobacco: Never Used  . Alcohol Use: Not on file   OB History   Grav Para Term Preterm Abortions TAB SAB Ect Mult Living                 Review of Systems  Constitutional: Negative for fever, activity change and appetite change.  HENT: Negative for congestion and rhinorrhea.   Respiratory: Negative for cough, chest tightness and shortness of breath.   Cardiovascular: Negative for chest pain.  Gastrointestinal: Negative for nausea and abdominal pain.  Genitourinary: Negative for dysuria, hematuria, vaginal bleeding and vaginal discharge.  Musculoskeletal: Negative for arthralgias, back pain and myalgias.  Skin: Negative for rash.  Neurological: Negative for dizziness, weakness and headaches.   Psychiatric/Behavioral: Negative for hallucinations and behavioral problems.  A complete 10 system review of systems was obtained and all systems are negative except as noted in the HPI and PMH.      Allergies  Review of patient's allergies indicates no known allergies.  Home Medications   Prior to Admission medications   Medication Sig Start Date End Date Taking? Authorizing Provider  acetaminophen (TYLENOL) 500 MG tablet Take 500 mg by mouth every 6 (six) hours as needed for mild pain.    Yes Historical Provider, MD  Artificial Tear Ointment (LACRI-LUBE OP) Place 1 application into the right eye at bedtime.   Yes Historical Provider, MD  atorvastatin (LIPITOR) 20 MG tablet Take 20 mg by mouth at bedtime.    Yes Historical Provider, MD  bisacodyl (BISACODYL) 5 MG EC tablet Take 5 mg by mouth daily.   Yes Historical Provider, MD  clonazePAM (KLONOPIN) 0.5 MG tablet Take 0.25 mg by mouth 2 (two) times daily as needed for anxiety.   Yes Historical Provider, MD  ergocalciferol (VITAMIN D2) 50000 UNITS capsule Take 50,000 Units by mouth once a week.   Yes Historical Provider, MD  latanoprost (XALATAN) 0.005 % ophthalmic solution Place 1 drop into the right eye at bedtime.    Yes Historical Provider, MD  Multiple Vitamin (MULTIVITAMIN WITH MINERALS) TABS tablet Take 1 tablet by mouth daily.   Yes Historical Provider, MD  polyvinyl alcohol (LIQUIFILM TEARS) 1.4 % ophthalmic solution Place 2 drops into the right eye 4 (four) times daily.   Yes Historical Provider, MD  risperiDONE (RISPERDAL) 1 MG tablet Take 1 mg by  mouth every evening.   Yes Historical Provider, MD  traZODone (DESYREL) 50 MG tablet Take 50 mg by mouth at bedtime.   Yes Historical Provider, MD  Vilazodone HCl (VIIBRYD) 40 MG TABS Take 40 mg by mouth every evening.   Yes Historical Provider, MD  vitamin B-12 (CYANOCOBALAMIN) 1000 MCG tablet Take 1,000 mcg by mouth daily.   Yes Historical Provider, MD  cephALEXin (KEFLEX) 500 MG  capsule Take 1 capsule (500 mg total) by mouth 4 (four) times daily. 06/29/14   Glynn OctaveStephen Stetson Pelaez, MD   BP 161/69  Pulse 89  Temp(Src) 98.2 F (36.8 C) (Oral)  Resp 21  SpO2 99% Physical Exam  Nursing note and vitals reviewed. Constitutional: She is oriented to person, place, and time. She appears well-developed and well-nourished. No distress.  Calm and cooperative.  HENT:  Head: Normocephalic and atraumatic.  Mouth/Throat: Oropharynx is clear and moist. No oropharyngeal exudate.  Eyes: Conjunctivae and EOM are normal. Pupils are equal, round, and reactive to light.  Neck: Normal range of motion. Neck supple.  No meningismus.  Cardiovascular: Normal rate, regular rhythm, normal heart sounds and intact distal pulses.   No murmur heard. Pulmonary/Chest: Effort normal and breath sounds normal. No respiratory distress.  Abdominal: Soft. There is no tenderness. There is no rebound and no guarding.  Musculoskeletal: Normal range of motion. She exhibits no edema and no tenderness.  Neurological: She is alert and oriented to person, place, and time. No cranial nerve deficit. She exhibits normal muscle tone. Coordination normal.  No ataxia on finger to nose bilaterally. No pronator drift. 5/5 strength throughout. CN 2-12 intact. Negative Romberg. Equal grip strength. Sensation intact. Gait is normal.   Skin: Skin is warm.  Psychiatric: She has a normal mood and affect. Her behavior is normal.    ED Course  Procedures (including critical care time) Labs Review Labs Reviewed  COMPREHENSIVE METABOLIC PANEL - Abnormal; Notable for the following:    Glucose, Bld 116 (*)    Albumin 3.3 (*)    GFR calc non Af Amer 74 (*)    GFR calc Af Amer 86 (*)    All other components within normal limits  URINALYSIS, ROUTINE W REFLEX MICROSCOPIC - Abnormal; Notable for the following:    APPearance CLOUDY (*)    Hgb urine dipstick SMALL (*)    Nitrite POSITIVE (*)    Leukocytes, UA LARGE (*)    All other  components within normal limits  URINE MICROSCOPIC-ADD ON - Abnormal; Notable for the following:    Bacteria, UA MANY (*)    All other components within normal limits  CBC WITH DIFFERENTIAL  TROPONIN I    Imaging Review Dg Chest 2 View  06/29/2014   CLINICAL DATA:  Hallucinations.  Altered mental status.  EXAM: CHEST  2 VIEW  COMPARISON:  05/25/2014  FINDINGS: Shallow inspiration. Heart size and pulmonary vascularity are normal for technique. No focal airspace disease or consolidation in the lungs. Suggestion of mild linear atelectasis in the lung bases. Degenerative changes in the spine and shoulders. Vascular calcifications. Compression of a lower thoracic or upper lumbar vertebra, stable since prior study.  IMPRESSION: Shallow inspiration with atelectasis in the lung bases.   Electronically Signed   By: Burman NievesWilliam  Stevens M.D.   On: 06/29/2014 21:15   Ct Head Wo Contrast  06/29/2014   CLINICAL DATA:  Hallucinations.  Patient fell.  EXAM: CT HEAD WITHOUT CONTRAST  TECHNIQUE: Contiguous axial images were obtained from the base of the  skull through the vertex without intravenous contrast.  COMPARISON:  05/15/2007  FINDINGS: There is no acute intracranial hemorrhage, infarction, or mass lesion. There is moderately severe cerebral cortical and cerebellar atrophy with secondary ventricular dilatation. The findings have progressed since the prior study of 05/15/2007. No acute osseous abnormality.  IMPRESSION: No acute intracranial abnormality. Progressive moderately severe atrophy.   Electronically Signed   By: Geanie Cooley M.D.   On: 06/29/2014 22:11     EKG Interpretation   Date/Time:  Tuesday June 29 2014 22:10:41 EDT Ventricular Rate:  88 PR Interval:  156 QRS Duration: 85 QT Interval:  387 QTC Calculation: 468 R Axis:   1 Text Interpretation:  Sinus rhythm Atrial premature complexes No  significant change was found Confirmed by Manus Gunning  MD, Greogry Goodwyn [phone]) on  06/29/2014 10:17:33 PM       MDM   Final diagnoses:  Urinary tract infection without hematuria, site unspecified  Dementia, without behavioral disturbance   patient brought in by EMS with agitation. History of dementia. She is calm and cooperative on arrival. She denies complaints. She is alert and oriented x3.  Workup was remarkable for UTI. CT head negative.  Patient's son has arrived. He states she is 100% better. He feels this is a complete change from her behavior previously. He states she became agitated and refused to get up off the floor. He had difficulty controlling her behavior. She has since calmed down. He states it is a "night and day difference."  He feels he is at her baseline. Patient is alert and oriented x3. She denies any SI or HI. We'll treat urinary tract infection. Patient is well-hydrated and nontoxic appearing. She is calm and cooperative. She stable for discharge and followup with her PCP. No need for emergent psychiatric evaluation.   Glynn Octave, MD 06/29/14 [phone]

## 2014-06-29 NOTE — ED Notes (Signed)
Pt ambulated with minimal assistant, and po challenged with water no promblems

## 2014-06-29 NOTE — Discharge Instructions (Signed)

## 2014-07-11 ENCOUNTER — Encounter (HOSPITAL_COMMUNITY): Payer: Self-pay | Admitting: Radiology

## 2014-07-11 ENCOUNTER — Emergency Department (HOSPITAL_COMMUNITY): Payer: Medicare Other

## 2014-07-11 ENCOUNTER — Emergency Department (HOSPITAL_COMMUNITY)
Admission: EM | Admit: 2014-07-11 | Discharge: 2014-07-11 | Disposition: A | Discharge status: Home / Self Care | Payer: Medicare Other | Attending: Emergency Medicine | Admitting: Emergency Medicine

## 2014-07-11 ENCOUNTER — Emergency Department (EMERGENCY_DEPARTMENT_HOSPITAL)
Admission: EM | Admit: 2014-07-11 | Discharge: 2014-07-13 | Disposition: A | Discharge status: Other Acute Inpatient Hospital | Payer: Medicare Other | Source: Home / Self Care

## 2014-07-11 ENCOUNTER — Encounter (HOSPITAL_COMMUNITY): Payer: Self-pay | Admitting: Emergency Medicine

## 2014-07-11 DIAGNOSIS — F32A Depression, unspecified: Secondary | ICD-10-CM

## 2014-07-11 DIAGNOSIS — R55 Syncope and collapse: Secondary | ICD-10-CM | POA: Diagnosis not present

## 2014-07-11 DIAGNOSIS — Z634 Disappearance and death of family member: Secondary | ICD-10-CM

## 2014-07-11 DIAGNOSIS — R5383 Other fatigue: Principal | ICD-10-CM

## 2014-07-11 DIAGNOSIS — F332 Major depressive disorder, recurrent severe without psychotic features: Secondary | ICD-10-CM

## 2014-07-11 DIAGNOSIS — F3289 Other specified depressive episodes: Secondary | ICD-10-CM | POA: Insufficient documentation

## 2014-07-11 DIAGNOSIS — Z79899 Other long term (current) drug therapy: Secondary | ICD-10-CM | POA: Insufficient documentation

## 2014-07-11 DIAGNOSIS — F419 Anxiety disorder, unspecified: Secondary | ICD-10-CM

## 2014-07-11 DIAGNOSIS — R5381 Other malaise: Secondary | ICD-10-CM | POA: Diagnosis present

## 2014-07-11 DIAGNOSIS — H409 Unspecified glaucoma: Secondary | ICD-10-CM | POA: Diagnosis not present

## 2014-07-11 DIAGNOSIS — F411 Generalized anxiety disorder: Secondary | ICD-10-CM | POA: Insufficient documentation

## 2014-07-11 DIAGNOSIS — F329 Major depressive disorder, single episode, unspecified: Secondary | ICD-10-CM

## 2014-07-11 DIAGNOSIS — IMO0002 Reserved for concepts with insufficient information to code with codable children: Secondary | ICD-10-CM | POA: Insufficient documentation

## 2014-07-11 DIAGNOSIS — R45851 Suicidal ideations: Secondary | ICD-10-CM

## 2014-07-11 DIAGNOSIS — R531 Weakness: Secondary | ICD-10-CM

## 2014-07-11 LAB — RAPID URINE DRUG SCREEN, HOSP PERFORMED
Amphetamines: NOT DETECTED
BARBITURATES: NOT DETECTED
BENZODIAZEPINES: NOT DETECTED
COCAINE: NOT DETECTED
Opiates: NOT DETECTED
TETRAHYDROCANNABINOL: NOT DETECTED

## 2014-07-11 LAB — ETHANOL

## 2014-07-11 LAB — COMPREHENSIVE METABOLIC PANEL
ALBUMIN: 3.4 g/dL — AB (ref 3.5–5.2)
ALK PHOS: 85 U/L (ref 39–117)
ALT: 18 U/L (ref 0–35)
AST: 15 U/L (ref 0–37)
Anion gap: 15 (ref 5–15)
BILIRUBIN TOTAL: 1.3 mg/dL — AB (ref 0.3–1.2)
BUN: 19 mg/dL (ref 6–23)
CHLORIDE: 99 meq/L (ref 96–112)
CO2: 23 meq/L (ref 19–32)
CREATININE: 0.89 mg/dL (ref 0.50–1.10)
Calcium: 9.4 mg/dL (ref 8.4–10.5)
GFR calc Af Amer: 67 mL/min — ABNORMAL LOW (ref >90)
GFR, EST NON AFRICAN AMERICAN: 57 mL/min — AB (ref >90)
Glucose, Bld: 118 mg/dL — ABNORMAL HIGH (ref 70–99)
POTASSIUM: 4.1 meq/L (ref 3.7–5.3)
SODIUM: 137 meq/L (ref 137–147)
Total Protein: 7 g/dL (ref 6.0–8.3)

## 2014-07-11 LAB — SALICYLATE LEVEL

## 2014-07-11 LAB — URINALYSIS, ROUTINE W REFLEX MICROSCOPIC
BILIRUBIN URINE: NEGATIVE
Glucose, UA: NEGATIVE mg/dL
KETONES UR: NEGATIVE mg/dL
NITRITE: NEGATIVE
Protein, ur: NEGATIVE mg/dL
SPECIFIC GRAVITY, URINE: 1.01 (ref 1.005–1.030)
Urobilinogen, UA: 0.2 mg/dL (ref 0.0–1.0)
pH: 6.5 (ref 5.0–8.0)

## 2014-07-11 LAB — CBC WITH DIFFERENTIAL/PLATELET
BASOS ABS: 0 10*3/uL (ref 0.0–0.1)
BASOS ABS: 0 10*3/uL (ref 0.0–0.1)
Basophils Relative: 0 % (ref 0–1)
Basophils Relative: 0 % (ref 0–1)
EOS ABS: 0.1 10*3/uL (ref 0.0–0.7)
Eosinophils Absolute: 0.1 10*3/uL (ref 0.0–0.7)
Eosinophils Relative: 1 % (ref 0–5)
Eosinophils Relative: 1 % (ref 0–5)
HCT: 40.1 % (ref 36.0–46.0)
HCT: 39.8 % (ref 36.0–46.0)
Hemoglobin: 13.3 g/dL (ref 12.0–15.0)
Hemoglobin: 13.1 g/dL (ref 12.0–15.0)
LYMPHS PCT: 16 % (ref 12–46)
Lymphocytes Relative: 14 % (ref 12–46)
Lymphs Abs: 1.5 10*3/uL (ref 0.7–4.0)
Lymphs Abs: 1.8 10*3/uL (ref 0.7–4.0)
MCH: 32.4 pg (ref 26.0–34.0)
MCH: 32.5 pg (ref 26.0–34.0)
MCHC: 33.2 g/dL (ref 30.0–36.0)
MCHC: 32.9 g/dL (ref 30.0–36.0)
MCV: 97.6 fL (ref 78.0–100.0)
MCV: 98.8 fL (ref 78.0–100.0)
Monocytes Absolute: 0.7 10*3/uL (ref 0.1–1.0)
Monocytes Absolute: 0.8 10*3/uL (ref 0.1–1.0)
Monocytes Relative: 6 % (ref 3–12)
Monocytes Relative: 7 % (ref 3–12)
NEUTROS ABS: 9 10*3/uL — AB (ref 1.7–7.7)
NEUTROS PCT: 76 % (ref 43–77)
Neutro Abs: 8.6 10*3/uL — ABNORMAL HIGH (ref 1.7–7.7)
Neutrophils Relative %: 79 % — ABNORMAL HIGH (ref 43–77)
PLATELETS: 148 10*3/uL — AB (ref 150–400)
Platelets: 151 10*3/uL (ref 150–400)
RBC: 4.11 MIL/uL (ref 3.87–5.11)
RBC: 4.03 MIL/uL (ref 3.87–5.11)
RDW: 13.6 % (ref 11.5–15.5)
RDW: 13.6 % (ref 11.5–15.5)
WBC: 10.8 10*3/uL — ABNORMAL HIGH (ref 4.0–10.5)
WBC: 11.7 10*3/uL — AB (ref 4.0–10.5)

## 2014-07-11 LAB — BASIC METABOLIC PANEL
Anion gap: 14 (ref 5–15)
BUN: 21 mg/dL (ref 6–23)
CHLORIDE: 104 meq/L (ref 96–112)
CO2: 24 meq/L (ref 19–32)
Calcium: 9.3 mg/dL (ref 8.4–10.5)
Creatinine, Ser: 0.94 mg/dL (ref 0.50–1.10)
GFR calc non Af Amer: 54 mL/min — ABNORMAL LOW (ref >90)
GFR, EST AFRICAN AMERICAN: 62 mL/min — AB (ref >90)
Glucose, Bld: 120 mg/dL — ABNORMAL HIGH (ref 70–99)
POTASSIUM: 4.2 meq/L (ref 3.7–5.3)
SODIUM: 142 meq/L (ref 137–147)

## 2014-07-11 LAB — TROPONIN I

## 2014-07-11 LAB — URINE MICROSCOPIC-ADD ON

## 2014-07-11 LAB — VALPROIC ACID LEVEL: Valproic Acid Lvl: 30.4 ug/mL — ABNORMAL LOW (ref 50.0–100.0)

## 2014-07-11 LAB — ACETAMINOPHEN LEVEL

## 2014-07-11 MED ORDER — POLYVINYL ALCOHOL 1.4 % OP SOLN
2.0000 [drp] | Freq: Four times a day (QID) | OPHTHALMIC | Status: DC
  Administered 2014-07-12 (×3): 2 [drp] via OPHTHALMIC
  Filled 2014-07-11: qty 15

## 2014-07-11 MED ORDER — ONDANSETRON HCL 4 MG/2ML IJ SOLN
4.0000 mg | Freq: Once | INTRAMUSCULAR | Status: AC
  Administered 2014-07-11: 4 mg via INTRAVENOUS
  Filled 2014-07-11: qty 2

## 2014-07-11 MED ORDER — LORAZEPAM 1 MG PO TABS
1.0000 mg | ORAL_TABLET | Freq: Once | ORAL | Status: AC
  Administered 2014-07-11: 1 mg via ORAL
  Filled 2014-07-11: qty 1

## 2014-07-11 MED ORDER — ADULT MULTIVITAMIN W/MINERALS CH
1.0000 | ORAL_TABLET | Freq: Every day | ORAL | Status: DC
  Administered 2014-07-12: 1 via ORAL
  Filled 2014-07-11: qty 1

## 2014-07-11 MED ORDER — RISPERIDONE 1 MG PO TABS
1.0000 mg | ORAL_TABLET | Freq: Once | ORAL | Status: AC
  Administered 2014-07-11: 1 mg via ORAL
  Filled 2014-07-11: qty 1

## 2014-07-11 MED ORDER — LORAZEPAM 1 MG PO TABS
1.0000 mg | ORAL_TABLET | Freq: Three times a day (TID) | ORAL | Status: DC | PRN
  Administered 2014-07-12 – 2014-07-13 (×3): 1 mg via ORAL
  Filled 2014-07-11 (×3): qty 1

## 2014-07-11 MED ORDER — ATORVASTATIN CALCIUM 20 MG PO TABS
20.0000 mg | ORAL_TABLET | Freq: Every day | ORAL | Status: DC
  Administered 2014-07-12: 20 mg via ORAL
  Filled 2014-07-11 (×3): qty 1

## 2014-07-11 MED ORDER — SODIUM CHLORIDE 0.9 % IV BOLUS (SEPSIS)
500.0000 mL | Freq: Once | INTRAVENOUS | Status: AC
  Administered 2014-07-11: 500 mL via INTRAVENOUS

## 2014-07-11 MED ORDER — DIVALPROEX SODIUM 125 MG PO CPSP
125.0000 mg | ORAL_CAPSULE | Freq: Two times a day (BID) | ORAL | Status: DC
  Administered 2014-07-12 (×2): 125 mg via ORAL
  Filled 2014-07-11 (×5): qty 1

## 2014-07-11 MED ORDER — RISPERIDONE 1 MG PO TABS
1.0000 mg | ORAL_TABLET | Freq: Every evening | ORAL | Status: DC
  Administered 2014-07-12: 1 mg via ORAL
  Filled 2014-07-11: qty 1

## 2014-07-11 MED ORDER — ACETAMINOPHEN 500 MG PO TABS: 500.0000 mg | ORAL_TABLET | Freq: Four times a day (QID) | ORAL | Status: DC | PRN

## 2014-07-11 MED ORDER — CLONAZEPAM 0.125 MG PO TBDP
0.2500 mg | ORAL_TABLET | Freq: Two times a day (BID) | ORAL | Status: DC | PRN
  Administered 2014-07-13: 0.25 mg via ORAL
  Filled 2014-07-11: qty 2

## 2014-07-11 MED ORDER — LATANOPROST 0.005 % OP SOLN
1.0000 [drp] | Freq: Every day | OPHTHALMIC | Status: DC
  Filled 2014-07-11: qty 2.5

## 2014-07-11 MED ORDER — VILAZODONE HCL 20 MG PO TABS
20.0000 mg | ORAL_TABLET | Freq: Every day | ORAL | Status: DC
  Administered 2014-07-12: 20 mg via ORAL
  Filled 2014-07-11 (×3): qty 1

## 2014-07-11 MED ORDER — BISACODYL 5 MG PO TBEC
5.0000 mg | DELAYED_RELEASE_TABLET | Freq: Every day | ORAL | Status: DC
  Administered 2014-07-12: 5 mg via ORAL
  Filled 2014-07-11 (×2): qty 1

## 2014-07-11 NOTE — Discharge Instructions (Signed)
If you were given medicines take as directed.  If you are on coumadin or contraceptives realize their levels and effectiveness is altered by many different medicines.  If you have any reaction (rash, tongues swelling, other) to the medicines stop taking and see a physician.   Please follow up as directed and return to the ER or see a physician for new or worsening symptoms.  Thank you. Filed Vitals:   07/11/14 1143 07/11/14 1149 07/11/14 1400 07/11/14 1430  BP:  112/57 130/70 130/80  Temp:  98.1 F (36.7 C)    TempSrc:  Oral    Resp:  20 16 18   SpO2: 94% 95%  98%

## 2014-07-11 NOTE — ED Notes (Signed)
Pt resides at  [facility] FULL CODE. Pt seen and treated here for presenting complaint of medical clearance. Pt cleared and returned back to nursing home. Pt tonight requesting son to spend the night. When son was unable to do so pt became combative, upset and threatened to hurt herself. This was according to staff. Per [facility] no behavior seen on scene or in route.

## 2014-07-11 NOTE — BH Assessment (Signed)
Called RN to clarify TTS is seeking placement and pt has not yet been accepted to another facility. She will call to inform son.  Mercedes BernhardtNancy Pawan Knechtel, [facility] Triage Specialist 07/11/2014 10:21 PM

## 2014-07-11 NOTE — ED Provider Notes (Signed)
CSN: [id number]     Arrival date & time 07/11/14  1143 History   First MD Initiated Contact with Patient 07/11/14 1143     Chief Complaint  Patient presents with  . Weakness     (Consider location/radiation/quality/duration/timing/severity/associated sxs/prior Treatment) HPI Comments: 80 year old female with history of major depression presents with general weakness and anxiety symptoms.  Patient lives in [facility] nursing facility and this morning felt generally weak and mild nausea. Patient says her main concern is she just feels anxious and currently denies any other symptoms. Nothing has improved her symptoms because she doesn't have any symptoms except for anxiety symptoms per her. Patient is at baseline mentally per report.  Patient is a 80 y.o. female presenting with weakness. The history is provided by the patient and medical records.  Weakness Pertinent negatives include no chest pain, no abdominal pain, no headaches and no shortness of breath.    Past Medical History  Diagnosis Date  . Depression   . Glaucoma    No past surgical history on file. No family history on file. History  Substance Use Topics  . Smoking status: Never Smoker   . Smokeless tobacco: Never Used  . Alcohol Use: Not on file   OB History   Grav Para Term Preterm Abortions TAB SAB Ect Mult Living                 Review of Systems  Constitutional: Negative for fever and chills.  HENT: Negative for congestion.   Eyes: Negative for visual disturbance.  Respiratory: Negative for shortness of breath.   Cardiovascular: Negative for chest pain.  Gastrointestinal: Negative for vomiting and abdominal pain.  Genitourinary: Negative for dysuria and flank pain.  Musculoskeletal: Negative for back pain, neck pain and neck stiffness.  Skin: Negative for rash.  Neurological: Positive for weakness. Negative for light-headedness and headaches.  Psychiatric/Behavioral: The patient is nervous/anxious.        Allergies  Review of patient's allergies indicates no known allergies.  Home Medications   Prior to Admission medications   Medication Sig Start Date End Date Taking? Authorizing Provider  acetaminophen (TYLENOL) 500 MG tablet Take 500 mg by mouth every 6 (six) hours as needed for mild pain.    Yes Historical Provider, MD  Artificial Tear Ointment (LACRI-LUBE OP) Place 1 application into the right eye at bedtime.   Yes Historical Provider, MD  atorvastatin (LIPITOR) 20 MG tablet Take 20 mg by mouth at bedtime.    Yes Historical Provider, MD  bisacodyl (BISACODYL) 5 MG EC tablet Take 5 mg by mouth daily.   Yes Historical Provider, MD  clonazePAM (KLONOPIN) 0.25 MG disintegrating tablet Take 0.25 mg by mouth 2 (two) times daily as needed (anxiety).   Yes Historical Provider, MD  divalproex (DEPAKOTE SPRINKLE) 125 MG capsule Take 125 mg by mouth 2 (two) times daily.   Yes Historical Provider, MD  ergocalciferol (VITAMIN D2) 50000 UNITS capsule Take 50,000 Units by mouth once a week.   Yes Historical Provider, MD  latanoprost (XALATAN) 0.005 % ophthalmic solution Place 1 drop into the right eye at bedtime.    Yes Historical Provider, MD  Multiple Vitamin (MULTIVITAMIN WITH MINERALS) TABS tablet Take 1 tablet by mouth daily.   Yes Historical Provider, MD  polyvinyl alcohol (LIQUIFILM TEARS) 1.4 % ophthalmic solution Place 2 drops into the right eye 4 (four) times daily.   Yes Historical Provider, MD  risperiDONE (RISPERDAL) 1 MG tablet Take 1 mg by mouth every  evening.   Yes Historical Provider, MD  Vilazodone HCl (VIIBRYD) 20 MG TABS Take 20 mg by mouth daily.   Yes Historical Provider, MD   BP 112/57  Temp(Src) 98.1 F (36.7 C) (Oral)  Resp 20  SpO2 95% Physical Exam  Nursing note and vitals reviewed. Constitutional: She is oriented to person, place, and time. She appears well-developed and well-nourished.  HENT:  Head: Normocephalic and atraumatic.  Mild dry mucous membranes   Eyes: Conjunctivae are normal. Right eye exhibits no discharge. Left eye exhibits no discharge.  Neck: Normal range of motion. Neck supple. No tracheal deviation present.  Cardiovascular: Normal rate and regular rhythm.   Pulmonary/Chest: Effort normal and breath sounds normal.  Abdominal: Soft. She exhibits no distension. There is no tenderness. There is no guarding.  Musculoskeletal: She exhibits no edema.  Neurological: She is alert and oriented to person, place, and time. GCS eye subscore is 4. GCS verbal subscore is 5. GCS motor subscore is 6.  Patient has equal 5 minus strength bilateral upper and lower extremities. Sensation intact upper and lower extremities. Pupils equal, extra the muscle function intact, no arm drift. Neck supple no meningismus.  Skin: Skin is warm. No rash noted.  Psychiatric:  Mild anxiety    ED Course  Procedures (including critical care time) Labs Review Labs Reviewed  CBC WITH DIFFERENTIAL - Abnormal; Notable for the following:    WBC 10.8 (*)    Platelets 148 (*)    Neutrophils Relative % 79 (*)    Neutro Abs 8.6 (*)    All other components within normal limits  COMPREHENSIVE METABOLIC PANEL - Abnormal; Notable for the following:    Glucose, Bld 118 (*)    Albumin 3.4 (*)    Total Bilirubin 1.3 (*)    GFR calc non Af Amer 57 (*)    GFR calc Af Amer 67 (*)    All other components within normal limits  URINALYSIS, ROUTINE W REFLEX MICROSCOPIC - Abnormal; Notable for the following:    APPearance CLOUDY (*)    Hgb urine dipstick TRACE (*)    Leukocytes, UA LARGE (*)    All other components within normal limits  URINE MICROSCOPIC-ADD ON - Abnormal; Notable for the following:    Squamous Epithelial / LPF MANY (*)    All other components within normal limits  URINE CULTURE  TROPONIN I    Imaging Review Ct Head Wo Contrast  07/11/2014   CLINICAL DATA:  80 year old female weakness and syncope.  EXAM: CT HEAD WITHOUT CONTRAST  TECHNIQUE: Contiguous  axial images were obtained from the base of the skull through the vertex without intravenous contrast.  COMPARISON:  06/29/2014  FINDINGS: Mild atrophy and chronic small-vessel white matter ischemic changes again noted.  No acute intracranial abnormalities are identified, including mass lesion or mass effect, hydrocephalus, extra-axial fluid collection, midline shift, hemorrhage, or acute infarction.  The visualized bony calvarium is unremarkable.  IMPRESSION: No evidence of acute intracranial abnormality.  Atrophy and chronic small-vessel white matter ischemic changes.   Electronically Signed   By: Laveda Abbe M.D.   On: 07/11/2014 14:08   Dg Chest Port 1 View  07/11/2014   CLINICAL DATA:  Weakness.  EXAM: PORTABLE CHEST - 1 VIEW  COMPARISON:  06/29/2014  FINDINGS: The heart size and mediastinal contours are within normal limits. There is no evidence of pulmonary edema, consolidation, pneumothorax, nodule or pleural fluid. The visualized skeletal structures are unremarkable.  IMPRESSION: No active disease.  Electronically Signed   By: Irish LackGlenn  Yamagata M.D.   On: 07/11/2014 12:39     EKG Interpretation   Date/Time:  Sunday July 11 2014 11:50:14 EDT Ventricular Rate:  89 PR Interval:  159 QRS Duration: 83 QT Interval:  351 QTC Calculation: 427 R Axis:   -20 Text Interpretation:  Sinus rhythm Ventricular premature complex  Borderline left axis deviation Baseline wander in lead(s) II Confirmed by  Reita Shindler  MD, Parris Cudworth ([id number]) on 07/11/2014 12:17:18 PM      MDM   Final diagnoses:  General weakness   Elderly patient and the nursing home with general weakness and anxiety symptoms. Nonfocal exam, plan for screening blood work for possible cause of general weakness, urinalysis and monitoring.  Patient recheck. Screening blood work, CT scan unremarkable. Urinalysis many squamous cells. Family arrived we discussed the results and they're comfortable with outpatient followup. Primary Dr. followup urine  culture. Oral fluids given the patient in ER.  Results and differential diagnosis were discussed with the patient/parent/guardian. Close follow up outpatient was discussed, comfortable with the plan.   Medications  ondansetron (ZOFRAN) injection 4 mg (4 mg Intravenous Given 07/11/14 1251)  sodium chloride 0.9 % bolus 500 mL (0 mLs Intravenous Stopped 07/11/14 1441)    Filed Vitals:   07/11/14 1143 07/11/14 1149 07/11/14 1400 07/11/14 1430  BP:  112/57 130/70 130/80  Temp:  98.1 F (36.7 C)    TempSrc:  Oral    Resp:  20 16 18   SpO2: 94% 95%  98%         Enid SkeensJoshua M Maurianna Benard, MD 07/11/14 1444

## 2014-07-11 NOTE — BH Assessment (Signed)
Relayed results of assessment to Mercedes Augustori Burkett, NP. Per [name] pt meets inpt criteria and TTS should seek [facility] as [facility] was recently there.   EDP, Dr. Silverio LayYao is in agreement with plan.   Informed RN of plan, who will pass information on to pt and her son who is POA.   TTS to seek placement.   Clista BernhardtNancy Pesach Frisch, [facility] Triage Specialist 07/11/2014 9:09 PM

## 2014-07-11 NOTE — ED Provider Notes (Signed)
CSN: [id number]     Arrival date & time 07/11/14  1905 History   First MD Initiated Contact with Patient 07/11/14 1914     Chief Complaint  Patient presents with  . Dementia  . Depression     (Consider location/radiation/quality/duration/timing/severity/associated sxs/prior Treatment) The history is provided by the patient and a relative.  [name] is a 80 y.o. female hx of depression, anxiety here with worsening anxiety. Patient was seen in the ED for similar symptoms. Had nl CT head, labs, cxr. UA contaminated and was sent back to facility. Once she got back, she became very anxious, tried to cut herself with a plastic knife. She also was yelling and screaming. She was threatening to kill herself. She told EMS that she was anxious because her son didn't want to stay with her. Her son said that he was with her and she was very agitated. Recently went to [facility] and had medications changed.    Past Medical History  Diagnosis Date  . Depression   . Glaucoma    History reviewed. No pertinent past surgical history. No family history on file. History  Substance Use Topics  . Smoking status: Never Smoker   . Smokeless tobacco: Never Used  . Alcohol Use: Not on file   OB History   Grav Para Term Preterm Abortions TAB SAB Ect Mult Living                 Review of Systems  Psychiatric/Behavioral: Positive for behavioral problems and agitation. The patient is nervous/anxious.   All other systems reviewed and are negative.     Allergies  Review of patient's allergies indicates no known allergies.  Home Medications   Prior to Admission medications   Medication Sig Start Date End Date Taking? Authorizing Provider  acetaminophen (TYLENOL) 500 MG tablet Take 500 mg by mouth every 6 (six) hours as needed for mild pain.    Yes Historical Provider, MD  Artificial Tear Ointment (LACRI-LUBE OP) Place 1 application into the right eye at bedtime.   Yes Historical Provider,  MD  atorvastatin (LIPITOR) 20 MG tablet Take 20 mg by mouth at bedtime.    Yes Historical Provider, MD  bisacodyl (BISACODYL) 5 MG EC tablet Take 5 mg by mouth daily.   Yes Historical Provider, MD  clonazePAM (KLONOPIN) 0.25 MG disintegrating tablet Take 0.25 mg by mouth 2 (two) times daily as needed (anxiety).   Yes Historical Provider, MD  divalproex (DEPAKOTE SPRINKLE) 125 MG capsule Take 125 mg by mouth 2 (two) times daily.   Yes Historical Provider, MD  ergocalciferol (VITAMIN D2) 50000 UNITS capsule Take 50,000 Units by mouth once a week.   Yes Historical Provider, MD  latanoprost (XALATAN) 0.005 % ophthalmic solution Place 1 drop into the right eye at bedtime.    Yes Historical Provider, MD  Multiple Vitamin (MULTIVITAMIN WITH MINERALS) TABS tablet Take 1 tablet by mouth daily.   Yes Historical Provider, MD  polyvinyl alcohol (LIQUIFILM TEARS) 1.4 % ophthalmic solution Place 2 drops into the right eye 4 (four) times daily.   Yes Historical Provider, MD  risperiDONE (RISPERDAL) 1 MG tablet Take 1 mg by mouth every evening.   Yes Historical Provider, MD  Vilazodone HCl (VIIBRYD) 20 MG TABS Take 20 mg by mouth daily.   Yes Historical Provider, MD   BP 99/73  Pulse 93  Temp(Src) 97.9 F (36.6 C) (Oral)  Resp 20  SpO2 95% Physical Exam  Nursing note and vitals reviewed.  Constitutional: She is oriented to person, place, and time.  Anxious, tearful, agitated   HENT:  Head: Normocephalic.  Mouth/Throat: Oropharynx is clear and moist.  Eyes: Conjunctivae are normal. Pupils are equal, round, and reactive to light.  Neck: Normal range of motion. Neck supple.  Cardiovascular: Normal rate, regular rhythm and normal heart sounds.   Pulmonary/Chest: Effort normal and breath sounds normal. No respiratory distress. She has no wheezes. She has no rales.  Abdominal: Soft. Bowel sounds are normal. She exhibits no distension. There is no tenderness. There is no rebound and no guarding.   Musculoskeletal: Normal range of motion. She exhibits no edema.  Neurological: She is alert and oriented to person, place, and time.  Cn 2-12 intact, moving all extremities   Skin: Skin is warm and dry.  Psychiatric:  Poor judgment     ED Course  Procedures (including critical care time) Labs Review Labs Reviewed  CBC WITH DIFFERENTIAL - Abnormal; Notable for the following:    WBC 11.7 (*)    Neutro Abs 9.0 (*)    All other components within normal limits  BASIC METABOLIC PANEL - Abnormal; Notable for the following:    Glucose, Bld 120 (*)    GFR calc non Af Amer 54 (*)    GFR calc Af Amer 62 (*)    All other components within normal limits  SALICYLATE LEVEL - Abnormal; Notable for the following:    Salicylate Lvl <2.0 (*)    All other components within normal limits  URINE RAPID DRUG SCREEN (HOSP PERFORMED)  ETHANOL  ACETAMINOPHEN LEVEL  VALPROIC ACID LEVEL    Imaging Review Ct Head Wo Contrast  07/11/2014   CLINICAL DATA:  80 year old female weakness and syncope.  EXAM: CT HEAD WITHOUT CONTRAST  TECHNIQUE: Contiguous axial images were obtained from the base of the skull through the vertex without intravenous contrast.  COMPARISON:  06/29/2014  FINDINGS: Mild atrophy and chronic small-vessel white matter ischemic changes again noted.  No acute intracranial abnormalities are identified, including mass lesion or mass effect, hydrocephalus, extra-axial fluid collection, midline shift, hemorrhage, or acute infarction.  The visualized bony calvarium is unremarkable.  IMPRESSION: No evidence of acute intracranial abnormality.  Atrophy and chronic small-vessel white matter ischemic changes.   Electronically Signed   By: Laveda Abbe M.D.   On: 07/11/2014 14:08   Dg Chest Port 1 View  07/11/2014   CLINICAL DATA:  Weakness.  EXAM: PORTABLE CHEST - 1 VIEW  COMPARISON:  06/29/2014  FINDINGS: The heart size and mediastinal contours are within normal limits. There is no evidence of pulmonary  edema, consolidation, pneumothorax, nodule or pleural fluid. The visualized skeletal structures are unremarkable.  IMPRESSION: No active disease.   Electronically Signed   By: Irish Lack M.D.   On: 07/11/2014 12:39     EKG Interpretation None      MDM   Final diagnoses:  None    [name] is a 80 y.o. female here with depression, anxiety. Will have TTS evaluate for geri psych placement. Will get psych clearance labs.   9:58 PM TTS saw patient. Recommend admission for geri psych facility. Holding orders placed. Transfer to psych ED.    Richardean Canal, MD 07/11/14 2158

## 2014-07-11 NOTE — BH Assessment (Signed)
Assessment Note  [name] is an 80 y.o. female with a history of depression, and anxiety presenting after making suicidal gestures and threats, and reporting visual hallucinations. Pt was seen earlier in the day in the ED after begging staff at her assisted living facility to call 911. When her son saw her in the ED she had been given medication for anxiety and was calm. He returned her to her home at [facility]. Pt then became upset because son refused to stay the [facility]. Per son Pt then grabbed a pen and threatened to hurt herself, and made comments and gestures about going out the window. She also repeatedly requested to have 911 called and began pulling her hair and beating on the furniture. Pt does not recall most of this but does say she was angry that her son had not given her the answer she wanted.   Pt denies SI/HI and a/v hallucinations, but son reports pt was reporting she saw a woman at the door today and son reports no one was there. Pt reports she is depressed that she was moved into assisted living and out of "my beautiful condo." She is upset that she now has one room, and can not fit all of her furniture. Pt initially said she has no complaints about [facility], no pain, and is not sure why she would be brought to the ED. Pt noted she worries about her children and grandchildren all the time, and gets upset when her children go out to do things without her. Pt noted she was upset son would not stay with her today after she offered to sleep on the floor, because "she was given new information today, and she did not want to be alone."   Son reports pt was out of control today and he feels "She needs help." Son reports pt is now living at [facility] permanently because they have tried moving her back to her condo several times and she decompensates each time after two days or so. Pt has similar issue with SI and acting out behaviors in June when son went on  vacation and daughter stayed with her. Pt ended up spending 22 days at [facility]. Since discharge pt has had medications further adjusted. Son reports pt has had several panic attacks, and does not seem to remember when she has them or when she makes suicidal threats.   Pt has no SA history, and had no previous SI gestures or threats before June 2015. Family history is positive for unknown MH concerns, as pt's sister was in an institution in [place]. Pt's husband passed away 8 years ago and remains a source of sadness for her. Pt was upset when hearing her son describe symptoms and kept asking if this was the last time (June) or today. She is resistant to stay in hospital but son feels unable to keep her safe at this time. Pt's mood has been labile today, with repeated requests for staff to call 911 and threats to harm herself, with periods of relative calm in between.   Axis I: 296.24 Major Depressive Disorder, Severe with psychotic features           300.00 Unspecified Anxiety Disorder, with panic attacks Axis II: Deferred Axis III:  Past Medical History  Diagnosis Date  . Depression   . Glaucoma    Axis IV: housing problems, other psychosocial or environmental problems and problems with primary support group Axis V: 33  Past Medical  History:  Past Medical History  Diagnosis Date  . Depression   . Glaucoma     History reviewed. No pertinent past surgical history.  Family History: No family history on file.  Social History:  reports that she has never smoked. She has never used smokeless tobacco. Her alcohol and drug histories are not on file.  Additional Social History:  Alcohol / Drug Use Pain Medications: SEE MAR Prescriptions: reports had medications changes in June when Pt was in [facility] and further adjusted by her primary psychiatrist Dr. Richardson Chiquito at [facility], SEE MAR Over the Counter: reports took tylenol today History of alcohol / drug use?: No history of alcohol / drug  abuse (Very small amounts of wine in the past "it does not agree with me.") Longest period of sobriety (when/how long): n/a  CIWA: CIWA-Ar BP: 99/73 mmHg Pulse Rate: 93 COWS:    Allergies: No Known Allergies  Home Medications:  (Not in a hospital admission)  OB/GYN Status:  No LMP recorded. Patient is postmenopausal.  General Assessment Data [facility] of [facility]: [facility] Is this a Tele or Face-to-Face Assessment?: Tele Assessment Is this an Initial Assessment or a Re-assessment for this encounter?: Initial Assessment Living Arrangements: Other (Comment) ([facility]) Can pt return to current living arrangement?: Yes Admission Status: Voluntary Is patient capable of signing voluntary admission?:  (Son is medical power of attorney) Transfer from: Other (Comment) Horticulturist, commercial) Referral Source: Self/Family/Friend Scientist, research (physical sciences) and son)     [facility] Crisis Care Plan Living Arrangements: Other (Comment) ([facility]) Name of Psychiatrist: Dr. Richardson Chiquito Name of Therapist: none  Education Status Is patient currently in school?: No Current Grade: n/a Contact person: [name] [phone] son, medical POA  Risk to self with the past 6 months Suicidal Ideation: Yes-Currently Present Suicidal Intent: No (Pt reports she would not hurt herself "I am too chicken") Is patient at risk for suicide?: Yes Suicidal Plan?: Yes-Currently Present Specify Current Suicidal Plan: threatened to go out window, stab self with pen, plastic knife Access to Means: Yes (items in environment) Specify Access to Suicidal Means: items in environment What has been your use of drugs/alcohol within the last 12 months?: very limited use of alcohol in the past Previous Attempts/Gestures: Yes How many times?: 1 (June 2015) Other Self Harm Risks: pulling hair today, banging on furniture Triggers for Past Attempts: Unpredictable Intentional Self Injurious Behavior: None Family  Suicide History:  (sister was in institution for MH problems) Recent stressful life event(s): Other (Comment) (moved out of condo into assisted living permanently ) Persecutory voices/beliefs?: No Depression: Yes Depression Symptoms: Despondent;Tearfulness;Feeling angry/irritable Substance abuse history and/or treatment for substance abuse?: No Suicide prevention information given to non-admitted patients: Not applicable  Risk to Others within the past 6 months Homicidal Ideation: No Thoughts of Harm to Others: No Current Homicidal Intent: No Current Homicidal Plan: No Access to Homicidal Means: No Identified Victim: none History of harm to others?: No Assessment of Violence: None Noted Violent Behavior Description: none Does patient have access to weapons?: No Criminal Charges Pending?: No Does patient have a court date: No  Psychosis Hallucinations: Visual (reported seeing a lady at door today, son did not see) Delusions: None noted  Mental Status Report Appear/Hygiene: Unremarkable Eye Contact: Good Motor Activity: Restlessness Speech: Logical/coherent Level of Consciousness: Alert Mood: Anxious;Irritable Affect:  (consistent with mood) Anxiety Level: Panic Attacks Panic attack frequency: son reports pt has had several one recently within last month Most recent panic attack: within last month  Thought Processes: Circumstantial Judgement: Impaired Orientation: Person;Place;Time (confused about situation) Obsessive Compulsive Thoughts/Behaviors: None  Cognitive Functioning Concentration: Normal Memory: Recent Impaired;Remote Impaired IQ: Average Insight: Poor Impulse Control: Poor Appetite: Good Weight Loss: 0 Weight Gain: 0 Sleep: No Change Total Hours of Sleep: 8 (takes medication to assist with sleep) Vegetative Symptoms: None  ADLScreening [facility] Assessment Services) Patient's cognitive ability adequate to safely complete daily activities?:  (Unsure at this  time it appears to vary greatly throughout the day) Patient able to express need for assistance with ADLs?: Yes Independently performs ADLs?: Yes (appropriate for developmental age)  Prior Inpatient Therapy Prior Inpatient Therapy: Yes Prior Therapy Dates: 6/15 Prior Therapy Facilty/Provider(s): [facility] Reason for Treatment: SI, depression, anxiety  Prior Outpatient Therapy Prior Outpatient Therapy: Yes Prior Therapy Dates: current Prior Therapy Facilty/Provider(s): see psychiatrist at [facility] "something" Dr. Richardson Chiquito Reason for Treatment: depression and anxiety  ADL Screening (condition at time of admission) Patient's cognitive ability adequate to safely complete daily activities?:  (Unsure at this time it appears to vary greatly throughout the day) Is the patient deaf or have difficulty hearing?: No Does the patient have difficulty seeing, even when wearing glasses/contacts?: No Does the patient have difficulty concentrating, remembering, or making decisions?: Yes Patient able to express need for assistance with ADLs?: Yes Independently performs ADLs?: Yes (appropriate for developmental age)       Abuse/Neglect Assessment (Assessment to be complete while patient is alone) Physical Abuse: Denies Verbal Abuse: Denies Sexual Abuse: Denies Exploitation of patient/patient's resources: Denies Self-Neglect: Denies Values / Beliefs Cultural Requests During Hospitalization: Other (comment) ([place]) Spiritual Requests During Hospitalization: None   Advance Directives (For Healthcare) Advance Directive: Patient does not have advance directive Pre-existing out of facility DNR order (yellow form or pink MOST form): No Nutrition Screen- [facility] Adult/[facility] Patient's home diet: Regular  Additional Information 1:1 In Past 12 Months?: Yes CIRT Risk: No Elopement Risk: No Does patient have medical clearance?: Yes     Disposition:   Per Janann August, NP pt meets inpt criteria and TTS  should seek placement. Dr. Silverio Lay is agreement with this plan. RN will inform pt and son of plan to seek placement. TTS will seek placement at [facility] as pt was recently inpt there.   Clista Bernhardt, [facility] Triage Specialist 07/11/2014 9:27 PM    On Site Evaluation by:   Reviewed with Physician:    Resa Miner 07/11/2014 9:22 PM

## 2014-07-11 NOTE — ED Notes (Addendum)
Per ems [facility] is [facility]. Staff report pt has been very weak today, normally ambulatory, but her legs get weak and she just wants to drop to the floor. Pt c/o of nausea. Denies pain. Pt at mental status baseline.

## 2014-07-11 NOTE — BH Assessment (Signed)
Faxed referral to [facility]. Per Starr SinclairJennifer, [facility] has no beds available tonight or tomorrow but will look at referral.   TTS to seek placement at other geropsych. Facilities.   [facility]: Per Misty StanleyLisa no bed this evening but will have two female discharges in the morning. Will fax referral.  [facility]: per Jonny RuizJohn they only have adolescent female bed available [facility]: per Marchelle FolksAmanda no gero beds available. 1 bed that may be filled by their ED.   [facility]: per [facility] available  Clista BernhardtNancy Elese Rane, [facility] Triage Specialist 07/11/2014 10:09 PM

## 2014-07-11 NOTE — ED Notes (Signed)
Writer offered fluids to pt.

## 2014-07-11 NOTE — ED Notes (Signed)
Pt it to admitted to [facility]. Pt son would like to be contacted when pt is ready for transport. Illene Labradorddy (son) [phone]([phone](cell)  [phone](work)

## 2014-07-11 NOTE — ED Notes (Signed)
Bed: [id number] Expected date:  Expected time:  Means of arrival:  Comments: EMS/nursing home pt./med. clearance

## 2014-07-11 NOTE — BH Assessment (Signed)
Per ChiChi RN, she contacted son and he is nervous about sending pt to a different facility as they are familiar with [facility]. Nurse informed there are no discharges pending at [facility] and that she could not say when a bed would be available. Son would like a call back to discuss options.   Debroah [facility]er[facility]ie [facility] home [phone]                             Cell [phone]  Spoke with son who reports that he is concerned that [facility] may not be a good placement due to online ratings. Discussed limited geropsycho options that are near to him as he would be participating in pt's care. [facility].   Son prefers if pt goes to [facility] and if TTS can check on possible discharges tomorrow.   Clista BernhardtNancy Fortino Haag, [facility] Triage Specialist 07/11/2014 10:48 PM

## 2014-07-11 NOTE — ED Notes (Signed)
Bed: [id number] Expected date:  Expected time:  Means of arrival:  Comments: weakness

## 2014-07-11 NOTE — BH Assessment (Signed)
[facility] to have TA equipment set up.  Spoke with EDP Dr. Silverio LayYao who reports 80 year old female from assisted living with worsening depression and anxiety. Pt was seen earlier today, given anxiety medication and sent home. She then proceeded to try to cut herself with a plastic knife. Son is with her and per Dr. Silverio LayYao will provide information as pt is very agitated.    TA to commence shortly.   Clista BernhardtNancy Abdulkareem Badolato, [facility] Triage Specialist 07/11/2014 8:14 PM

## 2014-07-12 ENCOUNTER — Encounter (HOSPITAL_COMMUNITY): Payer: Self-pay | Admitting: Psychiatry

## 2014-07-12 DIAGNOSIS — R45851 Suicidal ideations: Secondary | ICD-10-CM

## 2014-07-12 DIAGNOSIS — F332 Major depressive disorder, recurrent severe without psychotic features: Secondary | ICD-10-CM

## 2014-07-12 DIAGNOSIS — R5381 Other malaise: Secondary | ICD-10-CM | POA: Diagnosis not present

## 2014-07-12 LAB — URINE CULTURE
COLONY COUNT: NO GROWTH
CULTURE: NO GROWTH

## 2014-07-12 MED ORDER — CEPHALEXIN 500 MG PO CAPS
500.0000 mg | ORAL_CAPSULE | Freq: Three times a day (TID) | ORAL | Status: DC
Start: 2014-07-12 — End: 2014-07-13
  Administered 2014-07-12 – 2014-07-13 (×3): 500 mg via ORAL
  Filled 2014-07-12 (×3): qty 1

## 2014-07-12 MED ORDER — PROMETHAZINE HCL 25 MG PO TABS
12.5000 mg | ORAL_TABLET | Freq: Once | ORAL | Status: AC
  Administered 2014-07-12: 12.5 mg via ORAL
  Filled 2014-07-12: qty 1

## 2014-07-12 MED ORDER — LIDOCAINE HCL 1 % IJ SOLN
INTRAMUSCULAR | Status: AC
  Administered 2014-07-12: 2.1 mL
  Filled 2014-07-12: qty 20

## 2014-07-12 MED ORDER — CEFTRIAXONE SODIUM 1 G IJ SOLR
1.0000 g | Freq: Once | INTRAMUSCULAR | Status: AC
  Administered 2014-07-12: 1 g via INTRAMUSCULAR
  Filled 2014-07-12: qty 10

## 2014-07-12 MED ORDER — ONDANSETRON 4 MG PO TBDP
4.0000 mg | ORAL_TABLET | Freq: Once | ORAL | Status: AC
  Administered 2014-07-12: 4 mg via ORAL
  Filled 2014-07-12: qty 1

## 2014-07-12 MED ORDER — PROMETHAZINE HCL 25 MG PO TABS
25.0000 mg | ORAL_TABLET | Freq: Once | ORAL | Status: AC
  Administered 2014-07-12: 25 mg via ORAL
  Filled 2014-07-12: qty 1

## 2014-07-12 NOTE — BH Assessment (Signed)
[facility] Assessment Progress Note  Pt has been tentatively accepted to [facility] by Lowanda Foster, MD, per Nicholos Johns at 10:10.  However, they will first need to discharge patients, and will call when they are ready to receive the pt.  Please call nurse-to-nurse report to [phone].  At 10:12 I called the [facility] and notified Ava.  Doylene Canning, MA Triage Specialist 07/12/2014 @ 10:17

## 2014-07-12 NOTE — ED Notes (Addendum)
Pt increasingly agitated, ambulation has become unsteady. Pt unsafe to ambulate independently at this time. Bed alarm placed, benefits of safety sitter discussed with charge RN. PRN ativan administered at this time, will re-evaluate. Toileting offered, pt able to empty bladder in bed pan. All staff in section made aware of fall risk. High Fall risk socks in place.

## 2014-07-12 NOTE — Progress Notes (Signed)
CSW spoke with Victorino DikeJennifer with [facility] who confirms the patient bed is available and she can be transferred.  CSW called and spoke with the family who reported that it would not be a good idea for them to transport because she because agitated with them therefore increasing the symptoms.  It was initially determined that [facility] will transport to [facility].     Maryelizabeth Rowanressa Rohnan Bartleson, MSW, [place]CSWA, 07/12/2014 Evening Clinical Social Worker [phone]

## 2014-07-12 NOTE — Consult Note (Signed)
[facility] Face-to-Face Psychiatry Consult   Reason for Consult:  Depression Referring Physician:  EDP  [name] is an 80 y.o. female. Total Time spent with patient: 20 minutes  Assessment: AXIS I:  Major Depression, Recurrent severe AXIS II:  Deferred AXIS III:   Past Medical History  Diagnosis Date  . Depression   . Glaucoma    AXIS IV:  other psychosocial or environmental problems, problems related to social environment and problems with primary support group AXIS V:  11-20 some danger of hurting self or others possible OR occasionally fails to maintain minimal personal hygiene OR gross impairment in communication  Plan:  Recommend psychiatric Inpatient admission when medically cleared.Dr. Dwyane Dee assessed the patient and concurs with the plan.  Subjective:   [name] is a 80 y.o. female patient admitted with depression and suicidal ideations with plan.  HPI:  On admission:  80 y.o. female with a history of depression, and anxiety presenting after making suicidal gestures and threats, and reporting visual hallucinations. Pt was seen earlier in the day in the ED after begging staff at her assisted living facility to call 911. When her son saw her in the ED she had been given medication for anxiety and was calm. He returned her to her home at [place]. Pt then became upset because son refused to stay the night with her there at [facility]. Per son Pt then grabbed a pen and threatened to hurt herself, and made comments and gestures about going out the window. She also repeatedly requested to have 911 called and began pulling her hair and beating on the furniture. Pt does not recall most of this but does say she was angry that her son had not given her the answer she wanted.   Pt denies SI/HI and a/v hallucinations, but son reports pt was reporting she saw a woman at the door today and son reports no one was there. Pt reports she is depressed that she was moved into assisted  living and out of "my beautiful condo." She is upset that she now has one room, and can not fit all of her furniture. Pt initially said she has no complaints about [facility], no pain, and is not sure why she would be brought to the ED. Pt noted she worries about her children and grandchildren all the time, and gets upset when her children go out to do things without her. Pt noted she was upset son would not stay with her today after she offered to sleep on the floor, because "she was given new information today, and she did not want to be alone."   Son reports pt was out of control today and he feels "She needs help." Son reports pt is now living at [place] permanently because they have tried moving her back to her condo several times and she decompensates each time after two days or so. Pt has similar issue with SI and acting out behaviors in June when son went on vacation and daughter stayed with her. Pt ended up spending 22 days at [facility]. Since discharge pt has had medications further adjusted. Son reports pt has had several panic attacks, and does not seem to remember when she has them or when she makes suicidal threats.  Pt has no SA history, and had no previous SI gestures or threats before June 2015. Family history is positive for unknown MH concerns, as pt's sister was in an institution in [place]. Pt's husband passed away 8 years ago  and remains a source of sadness for her. Pt was upset when hearing her son describe symptoms and kept asking if this was the last time (June) or today. She is resistant to stay in hospital but son feels unable to keep her safe at this time. Pt's mood has been labile today, with repeated requests for staff to call 911 and threats to harm herself, with periods of relative calm in between.   Today:  The patient remains depressed with suicidal ideations and plan to overdose.  Denies homicidal ideations and hallucinations.  Tearful on assessment.  HPI Elements:    Location:  generalized. Quality:  acute. Severity:  severe. Timing:  constant. Duration:  2 months. Context:  stressors.  Past Psychiatric History: Past Medical History  Diagnosis Date  . Depression   . Glaucoma     reports that she has never smoked. She has never used smokeless tobacco. Her alcohol and drug histories are not on file. History reviewed. No pertinent family history. Family History Substance Abuse: No Family Supports: Yes, List: (son and daughter Ludwig Clarks and [place]) Living Arrangements: Other (Comment) ([facility]) Can pt return to current living arrangement?: Yes Abuse/Neglect [facility] Physical Abuse: Denies Verbal Abuse: Denies Sexual Abuse: Denies Allergies:  No Known Allergies  ACT Assessment Complete:  Yes:    Educational Status    Risk to Self: Risk to self with the past 6 months Suicidal Ideation: Yes-Currently Present Suicidal Intent: No (Pt reports she would not hurt herself "I am too chicken") Is patient at risk for suicide?: Yes Suicidal Plan?: Yes-Currently Present Specify Current Suicidal Plan: threatened to go out window, stab self with pen, plastic knife Access to Means: Yes (items in environment) Specify Access to Suicidal Means: items in environment What has been your use of drugs/alcohol within the last 12 months?: very limited use of alcohol in the past Previous Attempts/Gestures: Yes How many times?: 1 (June 2015) Other Self Harm Risks: pulling hair today, banging on furniture Triggers for Past Attempts: Unpredictable Intentional Self Injurious Behavior: None Family Suicide History:  (sister was in institution for MH problems) Recent stressful life event(s): Other (Comment) (moved out of condo into assisted living permanently ) Persecutory voices/beliefs?: No Depression: Yes Depression Symptoms: Despondent;Tearfulness;Feeling angry/irritable Substance abuse history and/or treatment for substance abuse?: No Suicide prevention  information given to non-admitted patients: Not applicable  Risk to Others: Risk to Others within the past 6 months Homicidal Ideation: No Thoughts of Harm to Others: No Current Homicidal Intent: No Current Homicidal Plan: No Access to Homicidal Means: No Identified Victim: none History of harm to others?: No Assessment of Violence: None Noted Violent Behavior Description: none Does patient have access to weapons?: No Criminal Charges Pending?: No Does patient have a court date: No  Abuse: Abuse/Neglect Assessment (Assessment to be complete while patient is alone) Physical Abuse: Denies Verbal Abuse: Denies Sexual Abuse: Denies Exploitation of patient/patient's resources: Denies Self-Neglect: Denies  Prior Inpatient Therapy: Prior Inpatient Therapy Prior Inpatient Therapy: Yes Prior Therapy Dates: 6/15 Prior Therapy Facilty/Provider(s): [facility] Reason for Treatment: SI, depression, anxiety  Prior Outpatient Therapy: Prior Outpatient Therapy Prior Outpatient Therapy: Yes Prior Therapy Dates: current Prior Therapy Facilty/Provider(s): see psychiatrist at [facility] Dr. Valentino Hue Reason for Treatment: depression and anxiety  Additional Information: Additional Information 1:1 In Past 12 Months?: Yes CIRT Risk: No Elopement Risk: No Does patient have medical clearance?: Yes                  Objective: Blood  pressure 121/63, pulse 90, temperature 98.2 F (36.8 C), temperature source Oral, resp. rate 19, SpO2 94.00%.There is no height or weight on file to calculate BMI. Results for orders placed during the hospital encounter of 07/11/14 (from the past 72 hour(s))  CBC WITH DIFFERENTIAL     Status: Abnormal   Collection Time    07/11/14  7:37 PM      Result Value Ref Range   WBC 11.7 (*) 4.0 - 10.5 K/uL   RBC 4.03  3.87 - 5.11 MIL/uL   Hemoglobin 13.1  12.0 - 15.0 g/dL   HCT 39.8  36.0 - 46.0 %   MCV 98.8  78.0 - 100.0 fL   MCH 32.5  26.0 - 34.0 pg    MCHC 32.9  30.0 - 36.0 g/dL   RDW 13.6  11.5 - 15.5 %   Platelets 151  150 - 400 K/uL   Neutrophils Relative % 76  43 - 77 %   Neutro Abs 9.0 (*) 1.7 - 7.7 K/uL   Lymphocytes Relative 16  12 - 46 %   Lymphs Abs 1.8  0.7 - 4.0 K/uL   Monocytes Relative 7  3 - 12 %   Monocytes Absolute 0.8  0.1 - 1.0 K/uL   Eosinophils Relative 1  0 - 5 %   Eosinophils Absolute 0.1  0.0 - 0.7 K/uL   Basophils Relative 0  0 - 1 %   Basophils Absolute 0.0  0.0 - 0.1 K/uL  BASIC METABOLIC PANEL     Status: Abnormal   Collection Time    07/11/14  7:37 PM      Result Value Ref Range   Sodium 142  137 - 147 mEq/L   Potassium 4.2  3.7 - 5.3 mEq/L   Chloride 104  96 - 112 mEq/L   CO2 24  19 - 32 mEq/L   Glucose, Bld 120 (*) 70 - 99 mg/dL   BUN 21  6 - 23 mg/dL   Creatinine, Ser 0.94  0.50 - 1.10 mg/dL   Calcium 9.3  8.4 - 10.5 mg/dL   GFR calc non Af Amer 54 (*) >90 mL/min   GFR calc Af Amer 62 (*) >90 mL/min   Comment: (NOTE)     The eGFR has been calculated using the CKD EPI equation.     This calculation has not been validated in all clinical situations.     eGFR's persistently <90 mL/min signify possible Chronic Kidney     Disease.   Anion gap 14  5 - 15  ETHANOL     Status: None   Collection Time    07/11/14  7:37 PM      Result Value Ref Range   Alcohol, Ethyl (B) <11  0 - 11 mg/dL   Comment:            LOWEST DETECTABLE LIMIT FOR     SERUM ALCOHOL IS 11 mg/dL     FOR MEDICAL PURPOSES ONLY  SALICYLATE LEVEL     Status: Abnormal   Collection Time    07/11/14  7:37 PM      Result Value Ref Range   Salicylate Lvl <1.6 (*) 2.8 - 20.0 mg/dL  ACETAMINOPHEN LEVEL     Status: None   Collection Time    07/11/14  7:37 PM      Result Value Ref Range   Acetaminophen (Tylenol), Serum <15.0  10 - 30 ug/mL   Comment:  THERAPEUTIC CONCENTRATIONS VARY     SIGNIFICANTLY. A RANGE OF 10-30     ug/mL MAY BE AN EFFECTIVE     CONCENTRATION FOR MANY PATIENTS.     HOWEVER, SOME ARE BEST TREATED      AT CONCENTRATIONS OUTSIDE THIS     RANGE.     ACETAMINOPHEN CONCENTRATIONS     >150 ug/mL AT 4 HOURS AFTER     INGESTION AND >50 ug/mL AT 12     HOURS AFTER INGESTION ARE     OFTEN ASSOCIATED WITH TOXIC     REACTIONS.  VALPROIC ACID LEVEL     Status: Abnormal   Collection Time    07/11/14  7:38 PM      Result Value Ref Range   Valproic Acid Lvl 30.4 (*) 50.0 - 100.0 ug/mL   Comment: Performed at [facility] ([place])     Status: None   Collection Time    07/11/14  8:18 PM      Result Value Ref Range   Opiates NONE DETECTED  NONE DETECTED   Cocaine NONE DETECTED  NONE DETECTED   Benzodiazepines NONE DETECTED  NONE DETECTED   Amphetamines NONE DETECTED  NONE DETECTED   Tetrahydrocannabinol NONE DETECTED  NONE DETECTED   Barbiturates NONE DETECTED  NONE DETECTED   Comment:            DRUG SCREEN FOR MEDICAL PURPOSES     ONLY.  IF CONFIRMATION IS NEEDED     FOR ANY PURPOSE, NOTIFY LAB     WITHIN 5 DAYS.                LOWEST DETECTABLE LIMITS     FOR URINE DRUG SCREEN     Drug Class       Cutoff (ng/mL)     Amphetamine      1000     Barbiturate      200     Benzodiazepine   250     Tricyclics       539     Opiates          300     Cocaine          300     THC              50   Labs are reviewed and are pertinent for UTI, treatment in place.  Current Facility-Administered Medications  Medication Dose Route Frequency Provider Last Rate Last Dose  . acetaminophen (TYLENOL) tablet 500 mg  500 mg Oral Q6H PRN Wandra Arthurs, MD      . atorvastatin (LIPITOR) tablet 20 mg  20 mg Oral QHS Wandra Arthurs, MD      . bisacodyl (DULCOLAX) EC tablet 5 mg  5 mg Oral Daily Wandra Arthurs, MD   5 mg at 07/12/14 0931  . clonazepam (KLONOPIN) disintegrating tablet 0.25 mg  0.25 mg Oral BID PRN Wandra Arthurs, MD      . divalproex (DEPAKOTE SPRINKLE) capsule 125 mg  125 mg Oral BID Wandra Arthurs, MD   125 mg at 07/12/14 0931  . latanoprost (XALATAN) 0.005 %  ophthalmic solution 1 drop  1 drop Right Eye QHS Wandra Arthurs, MD      . LORazepam (ATIVAN) tablet 1 mg  1 mg Oral Q8H PRN Wandra Arthurs, MD   1 mg at 07/12/14 7673  . multivitamin with minerals tablet 1 tablet  1 tablet Oral Daily Wandra Arthurs, MD   1 tablet at 07/12/14 [phone]  . polyvinyl alcohol (LIQUIFILM TEARS) 1.4 % ophthalmic solution 2 drop  2 drop Right Eye QID Wandra Arthurs, MD   2 drop at 07/12/14 [phone]  . promethazine (PHENERGAN) tablet 25 mg  25 mg Oral Once Kristen N Ward, DO      . risperiDONE (RISPERDAL) tablet 1 mg  1 mg Oral QPM Wandra Arthurs, MD      . Vilazodone HCl TABS 20 mg  20 mg Oral Daily Wandra Arthurs, MD   20 mg at 07/12/14 7096   Current Outpatient Prescriptions  Medication Sig Dispense Refill  . acetaminophen (TYLENOL) 500 MG tablet Take 500 mg by mouth every 6 (six) hours as needed for mild pain.       . Artificial Tear Ointment (LACRI-LUBE OP) Place 1 application into the right eye at bedtime.      Marland Kitchen atorvastatin (LIPITOR) 20 MG tablet Take 20 mg by mouth at bedtime.       . bisacodyl (BISACODYL) 5 MG EC tablet Take 5 mg by mouth daily.      . clonazePAM (KLONOPIN) 0.25 MG disintegrating tablet Take 0.25 mg by mouth 2 (two) times daily as needed (anxiety).      Marland Kitchen divalproex (DEPAKOTE SPRINKLE) 125 MG capsule Take 125 mg by mouth 2 (two) times daily.      . ergocalciferol (VITAMIN D2) 50000 UNITS capsule Take 50,000 Units by mouth once a week.      . latanoprost (XALATAN) 0.005 % ophthalmic solution Place 1 drop into the right eye at bedtime.       . Multiple Vitamin (MULTIVITAMIN WITH MINERALS) TABS tablet Take 1 tablet by mouth daily.      . polyvinyl alcohol (LIQUIFILM TEARS) 1.4 % ophthalmic solution Place 2 drops into the right eye 4 (four) times daily.      . risperiDONE (RISPERDAL) 1 MG tablet Take 1 mg by mouth every evening.      . Vilazodone HCl (VIIBRYD) 20 MG TABS Take 20 mg by mouth daily.        Psychiatric Specialty Exam:     Blood pressure 121/63, pulse  90, temperature 98.2 F (36.8 C), temperature source Oral, resp. rate 19, SpO2 94.00%.There is no height or weight on file to calculate BMI.  General Appearance: Disheveled  Eye Sport and exercise psychologist::  Fair  Speech:  Normal Rate  Volume:  Normal  Mood:  Anxious and Depressed  Affect:  Congruent  Thought Process:  Coherent  Orientation:  Full (Time, Place, and Person)  Thought Content:  WDL  Suicidal Thoughts:  Yes.  with intent/plan  Homicidal Thoughts:  No  Memory:  Immediate;   Fair Recent;   Fair Remote;   Fair  Judgement:  Impaired  Insight:  Fair  Psychomotor Activity:  Increased  Concentration:  Fair  Recall:  [employer] of Knowledge:Fair  Language: Good  Akathisia:  No  Handed:  Right  AIMS (if indicated):     Assets:  Financial Resources/Insurance Housing Leisure Time Resilience Social Support Transportation  Sleep:      Musculoskeletal: Strength & Muscle Tone: within normal limits Gait & Station: normal Patient leans: N/A  Treatment Plan Summary: Daily contact with patient to assess and evaluate symptoms and progress in treatment Medication management; admit to inpatient gero-psychiatry at [facility].  Waylan Boga, PMH-NP 07/12/2014 12:00 PM

## 2014-07-12 NOTE — BH Assessment (Signed)
Patient accepted to [facility] this am pending a available bed. Writer told by Nicholos JohnsKathleen that patient's bed would be ready approx noon. Followed up with [facility] approx. 1500 to inquire about patient's bed and told that it is still not ready. Writer will continue to follow up.

## 2014-07-13 DIAGNOSIS — R5381 Other malaise: Secondary | ICD-10-CM | POA: Diagnosis not present

## 2014-07-13 NOTE — ED Notes (Signed)
Pt now IVC'd.  [facility].

## 2014-07-13 NOTE — ED Notes (Signed)
Pt got up to use the BR, noted to be more stable when walking.

## 2014-07-13 NOTE — ED Notes (Signed)
Spoke with Victorino DikeJennifer, RN at [facility]. States pt needs to be IVC'd before dispo.

## 2014-07-14 NOTE — Consult Note (Signed)
Patient seen, evaluated by me, treatment plan formulated by me. Patient is depressed, tearful, cannot contract for safety and needs inpatient psychiatric care

## 2014-11-08 ENCOUNTER — Emergency Department (HOSPITAL_COMMUNITY): Payer: Medicare Other

## 2014-11-08 ENCOUNTER — Encounter (HOSPITAL_COMMUNITY): Payer: Self-pay | Admitting: Emergency Medicine

## 2014-11-08 ENCOUNTER — Emergency Department (HOSPITAL_COMMUNITY)
Admission: EM | Admit: 2014-11-08 | Discharge: 2014-11-08 | Disposition: A | Discharge status: Home / Self Care | Payer: Medicare Other | Attending: Emergency Medicine | Admitting: Emergency Medicine

## 2014-11-08 DIAGNOSIS — H02103 Unspecified ectropion of right eye, unspecified eyelid: Secondary | ICD-10-CM | POA: Diagnosis not present

## 2014-11-08 DIAGNOSIS — Y998 Other external cause status: Secondary | ICD-10-CM | POA: Insufficient documentation

## 2014-11-08 DIAGNOSIS — E559 Vitamin D deficiency, unspecified: Secondary | ICD-10-CM | POA: Diagnosis not present

## 2014-11-08 DIAGNOSIS — F0390 Unspecified dementia without behavioral disturbance: Secondary | ICD-10-CM | POA: Insufficient documentation

## 2014-11-08 DIAGNOSIS — Z86718 Personal history of other venous thrombosis and embolism: Secondary | ICD-10-CM | POA: Insufficient documentation

## 2014-11-08 DIAGNOSIS — E539 Vitamin B deficiency, unspecified: Secondary | ICD-10-CM | POA: Diagnosis not present

## 2014-11-08 DIAGNOSIS — Y92121 Bathroom in nursing home as the place of occurrence of the external cause: Secondary | ICD-10-CM | POA: Diagnosis not present

## 2014-11-08 DIAGNOSIS — S0993XA Unspecified injury of face, initial encounter: Secondary | ICD-10-CM | POA: Diagnosis present

## 2014-11-08 DIAGNOSIS — F329 Major depressive disorder, single episode, unspecified: Secondary | ICD-10-CM | POA: Diagnosis not present

## 2014-11-08 DIAGNOSIS — Y9389 Activity, other specified: Secondary | ICD-10-CM | POA: Diagnosis not present

## 2014-11-08 DIAGNOSIS — Z792 Long term (current) use of antibiotics: Secondary | ICD-10-CM | POA: Diagnosis not present

## 2014-11-08 DIAGNOSIS — N390 Urinary tract infection, site not specified: Secondary | ICD-10-CM

## 2014-11-08 DIAGNOSIS — S0083XA Contusion of other part of head, initial encounter: Secondary | ICD-10-CM | POA: Diagnosis not present

## 2014-11-08 DIAGNOSIS — W1839XA Other fall on same level, initial encounter: Secondary | ICD-10-CM | POA: Insufficient documentation

## 2014-11-08 DIAGNOSIS — Z79899 Other long term (current) drug therapy: Secondary | ICD-10-CM | POA: Diagnosis not present

## 2014-11-08 DIAGNOSIS — Z7901 Long term (current) use of anticoagulants: Secondary | ICD-10-CM | POA: Diagnosis not present

## 2014-11-08 DIAGNOSIS — E785 Hyperlipidemia, unspecified: Secondary | ICD-10-CM | POA: Insufficient documentation

## 2014-11-08 DIAGNOSIS — W19XXXA Unspecified fall, initial encounter: Secondary | ICD-10-CM

## 2014-11-08 HISTORY — DX: Unspecified psychosis not due to a substance or known physiological condition: F29

## 2014-11-08 HISTORY — DX: Hyperlipidemia, unspecified: E78.5

## 2014-11-08 HISTORY — DX: Vitamin D deficiency, unspecified: E55.9

## 2014-11-08 HISTORY — DX: Unspecified dementia, unspecified severity, without behavioral disturbance, psychotic disturbance, mood disturbance, and anxiety: F03.90

## 2014-11-08 HISTORY — DX: Other pulmonary embolism without acute cor pulmonale: I26.99

## 2014-11-08 HISTORY — DX: Unspecified ectropion of right eye, unspecified eyelid: H02.103

## 2014-11-08 HISTORY — DX: Deficiency of other specified B group vitamins: E53.8

## 2014-11-08 LAB — CBC WITH DIFFERENTIAL/PLATELET
BASOS ABS: 0 10*3/uL (ref 0.0–0.1)
BASOS PCT: 0 % (ref 0–1)
EOS PCT: 2 % (ref 0–5)
Eosinophils Absolute: 0.2 10*3/uL (ref 0.0–0.7)
HEMATOCRIT: 37.8 % (ref 36.0–46.0)
Hemoglobin: 12.2 g/dL (ref 12.0–15.0)
Lymphocytes Relative: 16 % (ref 12–46)
Lymphs Abs: 1.7 10*3/uL (ref 0.7–4.0)
MCH: 32.1 pg (ref 26.0–34.0)
MCHC: 32.3 g/dL (ref 30.0–36.0)
MCV: 99.5 fL (ref 78.0–100.0)
Monocytes Absolute: 0.9 10*3/uL (ref 0.1–1.0)
Monocytes Relative: 9 % (ref 3–12)
Neutro Abs: 7.6 10*3/uL (ref 1.7–7.7)
Neutrophils Relative %: 73 % (ref 43–77)
PLATELETS: 162 10*3/uL (ref 150–400)
RBC: 3.8 MIL/uL — ABNORMAL LOW (ref 3.87–5.11)
RDW: 14.3 % (ref 11.5–15.5)
WBC: 10.3 10*3/uL (ref 4.0–10.5)

## 2014-11-08 LAB — BASIC METABOLIC PANEL
ANION GAP: 13 (ref 5–15)
BUN: 20 mg/dL (ref 6–23)
CALCIUM: 9.6 mg/dL (ref 8.4–10.5)
CO2: 25 mEq/L (ref 19–32)
CREATININE: 0.99 mg/dL (ref 0.50–1.10)
Chloride: 101 mEq/L (ref 96–112)
GFR calc Af Amer: 59 mL/min — ABNORMAL LOW (ref >90)
GFR calc non Af Amer: 51 mL/min — ABNORMAL LOW (ref >90)
Glucose, Bld: 127 mg/dL — ABNORMAL HIGH (ref 70–99)
Potassium: 4 mEq/L (ref 3.7–5.3)
Sodium: 139 mEq/L (ref 137–147)

## 2014-11-08 LAB — URINALYSIS, ROUTINE W REFLEX MICROSCOPIC
BILIRUBIN URINE: NEGATIVE
Glucose, UA: NEGATIVE mg/dL
Ketones, ur: NEGATIVE mg/dL
Nitrite: NEGATIVE
PH: 5.5 (ref 5.0–8.0)
Protein, ur: 30 mg/dL — AB
Specific Gravity, Urine: 1.014 (ref 1.005–1.030)
UROBILINOGEN UA: 1 mg/dL (ref 0.0–1.0)

## 2014-11-08 LAB — URINE MICROSCOPIC-ADD ON

## 2014-11-08 LAB — PROTIME-INR
INR: 1.26 (ref 0.00–1.49)
Prothrombin Time: 15.9 seconds — ABNORMAL HIGH (ref 11.6–15.2)

## 2014-11-08 MED ORDER — CEPHALEXIN 500 MG PO CAPS
500.0000 mg | ORAL_CAPSULE | Freq: Once | ORAL | Status: AC
  Administered 2014-11-08: 500 mg via ORAL
  Filled 2014-11-08: qty 1

## 2014-11-08 MED ORDER — CEPHALEXIN 500 MG PO CAPS: 500.0000 mg | ORAL_CAPSULE | Freq: Four times a day (QID) | ORAL | Status: DC

## 2014-11-08 MED ORDER — ACETAMINOPHEN 325 MG PO TABS
650.0000 mg | ORAL_TABLET | Freq: Once | ORAL | Status: AC
  Administered 2014-11-08: 650 mg via ORAL
  Filled 2014-11-08: qty 2

## 2014-11-08 NOTE — ED Notes (Signed)
Attempted urine sample . Will try again RN notifited

## 2014-11-08 NOTE — ED Notes (Signed)
[facility] to Sharetha [place] [facility]/aware of UTI and meds administered

## 2014-11-08 NOTE — ED Notes (Signed)
[facility] presents with a normally confused/hx of dementia [facility] for an unwitnessed fall.  Pt states that she is in no pain and no obvious deformities assessed.  Nursing home staff found patient sitting beside toilet but patient stated that she didn't fall.

## 2014-11-08 NOTE — Discharge Instructions (Signed)

## 2014-11-08 NOTE — ED Provider Notes (Signed)
CSN: [id number]     Arrival date & time 11/08/14  1934 History   First MD Initiated Contact with Patient 11/08/14 1948     Chief Complaint  Patient presents with  . Fall   HPI Patient presents to the emergency room after being found on the bathroom floor next to the toilet.  Nursing home staff concern that the patient fell. Patient states that she just was sitting down. She denies having any trouble with headache. She denies chest pain or shortness of breath. She denies vomiting or diarrhea. She denies any numbness or weakness.  Patient does not recall hitting her head or any other injuries. Past Medical History  Diagnosis Date  . Depression   . Pulmonary embolus   . Psychosis   . Hyperlipidemia   . Vitamin B12 deficiency   . Vitamin D deficiency   . 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 Dementia    History reviewed. No pertinent past surgical history. History reviewed. No pertinent family history. History  Substance Use Topics  . Smoking status: Unknown If Ever Smoked  . Smokeless tobacco: Not on file  . Alcohol Use: No   OB History    No data available     Review of Systems  All other systems reviewed and are negative.     Allergies  Tuberculin tests  Home Medications   Prior to Admission medications   Medication Sig Start Date End Date Taking? Authorizing Provider  acetaminophen (TYLENOL) 500 MG tablet Take 500 mg by mouth every 8 (eight) hours as needed for mild pain.   Yes Historical Provider, MD  atorvastatin (LIPITOR) 20 MG tablet Take 20 mg by mouth daily.   Yes Historical Provider, MD  diazepam (VALIUM) 5 MG tablet Take 5 mg by mouth every 8 (eight) hours as needed for anxiety.   Yes Historical Provider, MD  docusate sodium (COLACE) 100 MG capsule Take 100 mg by mouth 2 (two) times daily.   Yes Historical Provider, MD  HYDROcodone-acetaminophen (NORCO/VICODIN) 5-325 MG per tablet Take 1 tablet by mouth 2 (two) times daily.   Yes Historical Provider, MD   hydroxypropyl methylcellulose / hypromellose (ISOPTO TEARS / GONIOVISC) 2.5 % ophthalmic solution Place 1 drop into both eyes 2 (two) times daily.   Yes Historical Provider, MD  latanoprost (XALATAN) 0.005 % ophthalmic solution Place 1 drop into both eyes at bedtime.   Yes Historical Provider, MD  metoprolol tartrate (LOPRESSOR) 25 MG tablet Take 25 mg by mouth 2 (two) times daily.   Yes Historical Provider, MD  Multiple Vitamin (TAB-A-VITE PO) Take 1 tablet by mouth daily.   Yes Historical Provider, MD  pantoprazole (PROTONIX) 40 MG tablet Take 40 mg by mouth daily.   Yes Historical Provider, MD  PARoxetine (PAXIL) 40 MG tablet Take 40 mg by mouth every morning.   Yes Historical Provider, MD  traZODone (DESYREL) 50 MG tablet Take 50 mg by mouth at bedtime as needed for sleep.   Yes Historical Provider, MD  traZODone (DESYREL) 50 MG tablet Take 50 mg by mouth at bedtime.   Yes Historical Provider, MD  vitamin B-12 (CYANOCOBALAMIN) 1000 MCG tablet Take 1,000 mcg by mouth daily.   Yes Historical Provider, MD  Vitamin D, Ergocalciferol, (DRISDOL) 50000 UNITS CAPS capsule Take 50,000 Units by mouth every 7 (seven) days.   Yes Historical Provider, MD  warfarin (COUMADIN) 2.5 MG tablet Take 2.5 mg by mouth daily.   Yes Historical Provider, MD  cephALEXin [facility](KEFLEX)  500 MG capsule Take 1 capsule (500 mg total) by mouth 4 (four) times daily. 11/08/14   Linwood Dibbles, MD   BP 135/67 mmHg  Pulse 67  Temp(Src) 98.2 F (36.8 C) (Oral)  Resp 18  SpO2 94%  LMP  (LMP Unknown) Physical Exam  Constitutional: No distress.  Elderly, frail  HENT:  Head: Normocephalic.  Right Ear: External ear normal.  Left Ear: External ear normal.  Left-sided Forehead hematoma with a small abrasion  Eyes: Conjunctivae are normal. Right eye exhibits no discharge. Left eye exhibits no discharge. No scleral icterus.  Ectropion right inferior eyelid  Neck: Neck supple. No tracheal deviation present.  Cardiovascular: Normal rate,  regular rhythm and intact distal pulses.   Pulmonary/Chest: Effort normal and breath sounds normal. No stridor. No respiratory distress. She has no wheezes. She has no rales.  Abdominal: Soft. Bowel sounds are normal. She exhibits no distension. There is no tenderness. There is no rebound and no guarding.  Musculoskeletal: She exhibits no edema or tenderness.  Entire spine is nontender, full range of motion in all 4 extremities without pain or discomfort  Neurological: She is alert. She has normal strength. No cranial nerve deficit (no facial droop, extraocular movements intact, no slurred speech) or sensory deficit. She exhibits normal muscle tone. She displays no seizure activity. Coordination normal.  Patient was assisted by the emergency room nurses, she was able to walk around the emergency department without difficulty  Skin: Skin is warm and dry. No rash noted.  Psychiatric: She has a normal mood and affect.  Nursing note and vitals reviewed.   ED Course  Procedures (including critical care time) Labs Review Labs Reviewed  CBC WITH DIFFERENTIAL - Abnormal; Notable for the following:    RBC 3.80 (*)    All other components within normal limits  BASIC METABOLIC PANEL - Abnormal; Notable for the following:    Glucose, Bld 127 (*)    GFR calc non Af Amer 51 (*)    GFR calc Af Amer 59 (*)    All other components within normal limits  URINALYSIS, ROUTINE W REFLEX MICROSCOPIC - Abnormal; Notable for the following:    APPearance CLOUDY (*)    Hgb urine dipstick MODERATE (*)    Protein, ur 30 (*)    Leukocytes, UA LARGE (*)    All other components within normal limits  PROTIME-INR - Abnormal; Notable for the following:    Prothrombin Time 15.9 (*)    All other components within normal limits  URINE MICROSCOPIC-ADD ON - Abnormal; Notable for the following:    Bacteria, UA MANY (*)    Casts GRANULAR CAST (*)    All other components within normal limits  URINE CULTURE    Imaging  Review Dg Pelvis 1-2 Views  11/08/2014   CLINICAL DATA:  Possible unwitnessed fall today, history dementia, initial encounter  EXAM: PELVIS - 1-2 VIEW  COMPARISON:  None.  FINDINGS: Diffuse osseous demineralization.  Mild narrowing of the hip joints bilaterally.  SI joints inadequately visualized.  No definite fracture, dislocation or bone destruction.  Scattered phleboliths and atherosclerotic calcifications.  IMPRESSION: Osseous demineralization with mild degenerative changes of the hip joints bilaterally.  No definite acute abnormalities.   Electronically Signed   By: Ulyses Southward M.D.   On: 11/08/2014 21:20   Ct Head Wo Contrast  11/08/2014   CLINICAL DATA:  [facility] presents with confusion/hx of dementia. Patient [facility] for an unwitnessed fall. Pt states that she is  in no pain and no obvious deformities assessed. Nursing home staff found patient sitting beside toilet but patient stated that she didn't fall.  EXAM: CT HEAD WITHOUT CONTRAST  TECHNIQUE: Contiguous axial images were obtained from the base of the skull through the vertex without intravenous contrast.  COMPARISON:  None.  FINDINGS: Ventricles are normal configuration. There is ventricular and sulcal enlargement reflecting moderate to advanced atrophy. No parenchymal masses or mass effect. There is no evidence of a cortical infarct. Patchy white matter hypoattenuation noted consistent with moderate chronic microvascular ischemic change  There are no extra-axial masses or abnormal fluid collections.  No intracranial hemorrhage. No skull fracture. Visualized sinuses and mastoid air cells clear.  IMPRESSION: 1. No acute intracranial abnormalities. 2. Atrophy and chronic microvascular ischemic change.   Electronically Signed   By: Amie Portlandavid  Ormond M.D.   On: 11/08/2014 21:10     EKG Interpretation   Date/Time:  Monday November 08 2014 20:55:43 EST Ventricular Rate:  76 PR Interval:  131 QRS Duration: 93 QT Interval:  427 QTC  Calculation: 480 R Axis:   28 Text Interpretation:  Sinus rhythm Atrial premature complex No old tracing  to compare Confirmed by Beulah Matusek  MD-J, Makinley Muscato ([id number]) on 11/08/2014 9:00:37 PM      MDM   Final diagnoses:  Fall  UTI (lower urinary tract infection)    Pt has no focal areas of tenderness.  Her demenita does make it more challenging but she has been up and walking in the ED.  Will dc home with oral abx.  Follow up with PCP    Linwood DibblesJon Vickey Boak, MD 11/08/14 2303

## 2014-11-08 NOTE — ED Notes (Signed)
[facility] Expected date:  Expected time:  Means of arrival:  Comments: EMS- elderly, fall, eval

## 2014-11-08 NOTE — Progress Notes (Signed)
  CARE MANAGEMENT ED NOTE 11/08/2014  Patient:  [name],[name]   Account Number:  [id number]  Date Initiated:  11/08/2014  Documentation initiated by:  Radford PaxFERRERO,Shaheen Mende  Subjective/Objective Assessment:   Patientpresents to Ed with unwitnessed fall     Subjective/Objective Assessment Detail:     Action/Plan:   Action/Plan Detail:   Anticipated DC Date:  11/08/2014     Status Recommendation to Physician:   Result of Recommendation:    Other ED Services  Consult Working Plan    DC Planning Services  Other  PCP issues    Choice offered to / List presented to:            Status of service:  Completed, signed off  ED Comments:   ED Comments Detail:  [facility]'s chart via EMS[facility]  Patient's pcp is Dr. Duaine DredgeBlake R. Jones.  System updated.

## 2014-11-08 NOTE — ED Notes (Signed)
Pt ambulated with minimum   Assistance no complaints of pain RN notifited

## 2014-11-09 ENCOUNTER — Encounter (HOSPITAL_COMMUNITY): Payer: Self-pay | Admitting: Psychiatry

## 2014-11-09 LAB — URINE CULTURE: Colony Count: >=100000

## 2015-01-01 ENCOUNTER — Inpatient Hospital Stay (HOSPITAL_COMMUNITY)
Admission: EM | Admit: 2015-01-01 | Discharge: 2015-01-07 | DRG: 871 | Disposition: A | Discharge status: Skilled Nursing Facility | Payer: Medicare Other | Attending: Internal Medicine | Admitting: Internal Medicine

## 2015-01-01 ENCOUNTER — Emergency Department (HOSPITAL_COMMUNITY): Payer: Medicare Other

## 2015-01-01 ENCOUNTER — Encounter (HOSPITAL_COMMUNITY): Payer: Self-pay | Admitting: *Deleted

## 2015-01-01 DIAGNOSIS — I10 Essential (primary) hypertension: Secondary | ICD-10-CM | POA: Diagnosis present

## 2015-01-01 DIAGNOSIS — G4710 Hypersomnia, unspecified: Secondary | ICD-10-CM | POA: Diagnosis present

## 2015-01-01 DIAGNOSIS — Z86718 Personal history of other venous thrombosis and embolism: Secondary | ICD-10-CM

## 2015-01-01 DIAGNOSIS — E860 Dehydration: Secondary | ICD-10-CM | POA: Diagnosis present

## 2015-01-01 DIAGNOSIS — Z888 Allergy status to other drugs, medicaments and biological substances status: Secondary | ICD-10-CM | POA: Diagnosis not present

## 2015-01-01 DIAGNOSIS — I248 Other forms of acute ischemic heart disease: Secondary | ICD-10-CM | POA: Diagnosis present

## 2015-01-01 DIAGNOSIS — G9341 Metabolic encephalopathy: Secondary | ICD-10-CM | POA: Diagnosis present

## 2015-01-01 DIAGNOSIS — I493 Ventricular premature depolarization: Secondary | ICD-10-CM | POA: Diagnosis present

## 2015-01-01 DIAGNOSIS — A419 Sepsis, unspecified organism: Secondary | ICD-10-CM | POA: Diagnosis present

## 2015-01-01 DIAGNOSIS — B9620 Unspecified Escherichia coli [E. coli] as the cause of diseases classified elsewhere: Secondary | ICD-10-CM | POA: Diagnosis present

## 2015-01-01 DIAGNOSIS — D696 Thrombocytopenia, unspecified: Secondary | ICD-10-CM | POA: Diagnosis present

## 2015-01-01 DIAGNOSIS — H409 Unspecified glaucoma: Secondary | ICD-10-CM | POA: Diagnosis present

## 2015-01-01 DIAGNOSIS — F332 Major depressive disorder, recurrent severe without psychotic features: Secondary | ICD-10-CM | POA: Diagnosis present

## 2015-01-01 DIAGNOSIS — R778 Other specified abnormalities of plasma proteins: Secondary | ICD-10-CM | POA: Insufficient documentation

## 2015-01-01 DIAGNOSIS — Z66 Do not resuscitate: Secondary | ICD-10-CM | POA: Diagnosis present

## 2015-01-01 DIAGNOSIS — Z9181 History of falling: Secondary | ICD-10-CM | POA: Diagnosis not present

## 2015-01-01 DIAGNOSIS — R451 Restlessness and agitation: Secondary | ICD-10-CM

## 2015-01-01 DIAGNOSIS — R062 Wheezing: Secondary | ICD-10-CM | POA: Diagnosis present

## 2015-01-01 DIAGNOSIS — Z7901 Long term (current) use of anticoagulants: Secondary | ICD-10-CM

## 2015-01-01 DIAGNOSIS — Z515 Encounter for palliative care: Secondary | ICD-10-CM

## 2015-01-01 DIAGNOSIS — R7989 Other specified abnormal findings of blood chemistry: Secondary | ICD-10-CM

## 2015-01-01 DIAGNOSIS — N390 Urinary tract infection, site not specified: Secondary | ICD-10-CM | POA: Diagnosis present

## 2015-01-01 DIAGNOSIS — F419 Anxiety disorder, unspecified: Secondary | ICD-10-CM | POA: Diagnosis present

## 2015-01-01 DIAGNOSIS — Z86711 Personal history of pulmonary embolism: Secondary | ICD-10-CM

## 2015-01-01 DIAGNOSIS — F339 Major depressive disorder, recurrent, unspecified: Secondary | ICD-10-CM | POA: Diagnosis present

## 2015-01-01 DIAGNOSIS — F0391 Unspecified dementia with behavioral disturbance: Secondary | ICD-10-CM | POA: Diagnosis not present

## 2015-01-01 DIAGNOSIS — E785 Hyperlipidemia, unspecified: Secondary | ICD-10-CM | POA: Diagnosis present

## 2015-01-01 DIAGNOSIS — R5383 Other fatigue: Secondary | ICD-10-CM | POA: Diagnosis not present

## 2015-01-01 DIAGNOSIS — W19XXXA Unspecified fall, initial encounter: Secondary | ICD-10-CM

## 2015-01-01 DIAGNOSIS — F03918 Unspecified dementia, unspecified severity, with other behavioral disturbance: Secondary | ICD-10-CM

## 2015-01-01 LAB — TROPONIN I
TROPONIN I: 1.09 ng/mL — AB (ref <0.031)
TROPONIN I: 1.11 ng/mL — AB (ref <0.031)
Troponin I: 0.67 ng/mL (ref <0.031)

## 2015-01-01 LAB — CBC WITH DIFFERENTIAL/PLATELET
Basophils Absolute: 0 10*3/uL (ref 0.0–0.1)
Basophils Relative: 0 % (ref 0–1)
EOS PCT: 0 % (ref 0–5)
Eosinophils Absolute: 0 10*3/uL (ref 0.0–0.7)
HEMATOCRIT: 38.2 % (ref 36.0–46.0)
Hemoglobin: 12.4 g/dL (ref 12.0–15.0)
Lymphocytes Relative: 5 % — ABNORMAL LOW (ref 12–46)
Lymphs Abs: 0.8 10*3/uL (ref 0.7–4.0)
MCH: 31.8 pg (ref 26.0–34.0)
MCHC: 32.5 g/dL (ref 30.0–36.0)
MCV: 97.9 fL (ref 78.0–100.0)
MONOS PCT: 9 % (ref 3–12)
Monocytes Absolute: 1.3 10*3/uL — ABNORMAL HIGH (ref 0.1–1.0)
Neutro Abs: 12.2 10*3/uL — ABNORMAL HIGH (ref 1.7–7.7)
Neutrophils Relative %: 86 % — ABNORMAL HIGH (ref 43–77)
Platelets: 161 10*3/uL (ref 150–400)
RBC: 3.9 MIL/uL (ref 3.87–5.11)
RDW: 14.1 % (ref 11.5–15.5)
WBC: 14.3 10*3/uL — ABNORMAL HIGH (ref 4.0–10.5)

## 2015-01-01 LAB — COMPREHENSIVE METABOLIC PANEL
ALBUMIN: 3.5 g/dL (ref 3.5–5.2)
ALK PHOS: 85 U/L (ref 39–117)
ALT: 31 U/L (ref 0–35)
ANION GAP: 9 (ref 5–15)
AST: 93 U/L — ABNORMAL HIGH (ref 0–37)
BUN: 29 mg/dL — ABNORMAL HIGH (ref 6–23)
CHLORIDE: 112 mmol/L (ref 96–112)
CO2: 20 mmol/L (ref 19–32)
CREATININE: 1.07 mg/dL (ref 0.50–1.10)
Calcium: 8.9 mg/dL (ref 8.4–10.5)
GFR calc Af Amer: 53 mL/min — ABNORMAL LOW (ref >90)
GFR calc non Af Amer: 46 mL/min — ABNORMAL LOW (ref >90)
Glucose, Bld: 168 mg/dL — ABNORMAL HIGH (ref 70–99)
Potassium: 4 mmol/L (ref 3.5–5.1)
Sodium: 141 mmol/L (ref 135–145)
TOTAL PROTEIN: 7.3 g/dL (ref 6.0–8.3)
Total Bilirubin: 0.8 mg/dL (ref 0.3–1.2)

## 2015-01-01 LAB — MRSA PCR SCREENING: MRSA BY PCR: POSITIVE — AB

## 2015-01-01 LAB — URINALYSIS, ROUTINE W REFLEX MICROSCOPIC
Bilirubin Urine: NEGATIVE
Glucose, UA: NEGATIVE mg/dL
KETONES UR: NEGATIVE mg/dL
Nitrite: NEGATIVE
Protein, ur: 100 mg/dL — AB
Specific Gravity, Urine: 1.014 (ref 1.005–1.030)
Urobilinogen, UA: 0.2 mg/dL (ref 0.0–1.0)
pH: 5.5 (ref 5.0–8.0)

## 2015-01-01 LAB — URINE MICROSCOPIC-ADD ON

## 2015-01-01 LAB — PROTIME-INR
INR: 1.66 — ABNORMAL HIGH (ref 0.00–1.49)
PROTHROMBIN TIME: 19.8 s — AB (ref 11.6–15.2)

## 2015-01-01 MED ORDER — POLYVINYL ALCOHOL 1.4 % OP SOLN
1.0000 [drp] | Freq: Two times a day (BID) | OPHTHALMIC | Status: DC
  Administered 2015-01-01 – 2015-01-07 (×11): 1 [drp] via OPHTHALMIC
  Filled 2015-01-01: qty 15

## 2015-01-01 MED ORDER — LORAZEPAM 2 MG/ML IJ SOLN
1.0000 mg | Freq: Once | INTRAMUSCULAR | Status: AC
  Administered 2015-01-01: 1 mg via INTRAVENOUS

## 2015-01-01 MED ORDER — DIAZEPAM 5 MG PO TABS
5.0000 mg | ORAL_TABLET | Freq: Three times a day (TID) | ORAL | Status: DC | PRN
  Administered 2015-01-01 – 2015-01-03 (×4): 5 mg via ORAL
  Filled 2015-01-01 (×6): qty 1

## 2015-01-01 MED ORDER — ACETAMINOPHEN 500 MG PO TABS
500.0000 mg | ORAL_TABLET | Freq: Four times a day (QID) | ORAL | Status: DC | PRN
  Administered 2015-01-03: 500 mg via ORAL
  Filled 2015-01-01 (×2): qty 1

## 2015-01-01 MED ORDER — SODIUM CHLORIDE 0.9 % IV BOLUS (SEPSIS)
500.0000 mL | Freq: Once | INTRAVENOUS | Status: AC
  Administered 2015-01-01: 500 mL via INTRAVENOUS

## 2015-01-01 MED ORDER — PIPERACILLIN-TAZOBACTAM 3.375 G IVPB
3.3750 g | Freq: Three times a day (TID) | INTRAVENOUS | Status: DC
  Administered 2015-01-01 – 2015-01-05 (×11): 3.375 g via INTRAVENOUS
  Filled 2015-01-01 (×11): qty 50

## 2015-01-01 MED ORDER — DOCUSATE SODIUM 100 MG PO CAPS
100.0000 mg | ORAL_CAPSULE | Freq: Two times a day (BID) | ORAL | Status: DC
  Administered 2015-01-02 – 2015-01-07 (×7): 100 mg via ORAL
  Filled 2015-01-01 (×12): qty 1

## 2015-01-01 MED ORDER — SODIUM CHLORIDE 0.9 % IV SOLN
INTRAVENOUS | Status: DC
  Administered 2015-01-01: 1000 mL via INTRAVENOUS
  Administered 2015-01-01 – 2015-01-03 (×4): via INTRAVENOUS
  Administered 2015-01-04: 50 mL/h via INTRAVENOUS
  Administered 2015-01-06: 12:00:00 via INTRAVENOUS

## 2015-01-01 MED ORDER — DEXTROSE 5 % IV SOLN: 1.0000 g | INTRAVENOUS | Status: DC

## 2015-01-01 MED ORDER — HYPROMELLOSE (GONIOSCOPIC) 2.5 % OP SOLN: 1.0000 [drp] | Freq: Two times a day (BID) | OPHTHALMIC | Status: DC

## 2015-01-01 MED ORDER — LORAZEPAM 2 MG/ML IJ SOLN
INTRAMUSCULAR | Status: AC
Start: 2015-01-01 — End: 2015-01-01
  Filled 2015-01-01: qty 1

## 2015-01-01 MED ORDER — DEXTROSE 5 % IV SOLN
1.0000 g | Freq: Once | INTRAVENOUS | Status: AC
  Administered 2015-01-01: 1 g via INTRAVENOUS
  Filled 2015-01-01: qty 10

## 2015-01-01 MED ORDER — TRAZODONE HCL 50 MG PO TABS
50.0000 mg | ORAL_TABLET | Freq: Every day | ORAL | Status: DC
  Administered 2015-01-01 – 2015-01-02 (×2): 50 mg via ORAL
  Filled 2015-01-01 (×3): qty 1

## 2015-01-01 MED ORDER — SERTRALINE HCL 25 MG PO TABS
25.0000 mg | ORAL_TABLET | ORAL | Status: DC
  Administered 2015-01-01 – 2015-01-06 (×6): 25 mg via ORAL
  Filled 2015-01-01 (×9): qty 1

## 2015-01-01 MED ORDER — PANTOPRAZOLE SODIUM 40 MG PO TBEC
40.0000 mg | DELAYED_RELEASE_TABLET | Freq: Every day | ORAL | Status: DC
  Administered 2015-01-02 – 2015-01-07 (×6): 40 mg via ORAL
  Filled 2015-01-01 (×8): qty 1

## 2015-01-01 MED ORDER — LATANOPROST 0.005 % OP SOLN
1.0000 [drp] | Freq: Every day | OPHTHALMIC | Status: DC
  Administered 2015-01-01 – 2015-01-06 (×6): 1 [drp] via OPHTHALMIC
  Filled 2015-01-01: qty 2.5

## 2015-01-01 MED ORDER — HYDROCODONE-ACETAMINOPHEN 5-325 MG PO TABS
1.0000 | ORAL_TABLET | Freq: Two times a day (BID) | ORAL | Status: DC
  Administered 2015-01-01 – 2015-01-07 (×10): 1 via ORAL
  Filled 2015-01-01 (×12): qty 1

## 2015-01-01 MED ORDER — WARFARIN SODIUM 4 MG PO TABS
4.0000 mg | ORAL_TABLET | Freq: Once | ORAL | Status: AC
  Administered 2015-01-01: 4 mg via ORAL
  Filled 2015-01-01: qty 1

## 2015-01-01 MED ORDER — ONDANSETRON HCL 4 MG/2ML IJ SOLN
4.0000 mg | Freq: Four times a day (QID) | INTRAMUSCULAR | Status: DC | PRN
  Administered 2015-01-02 – 2015-01-05 (×3): 4 mg via INTRAVENOUS
  Filled 2015-01-01 (×3): qty 2

## 2015-01-01 MED ORDER — WARFARIN SODIUM 2.5 MG PO TABS: 2.5000 mg | ORAL_TABLET | Freq: Every day | ORAL | Status: DC

## 2015-01-01 MED ORDER — MIRTAZAPINE 30 MG PO TABS
30.0000 mg | ORAL_TABLET | Freq: Every day | ORAL | Status: DC
  Administered 2015-01-01 – 2015-01-06 (×5): 30 mg via ORAL
  Filled 2015-01-01 (×3): qty 2
  Filled 2015-01-01 (×4): qty 1

## 2015-01-01 MED ORDER — WARFARIN - PHARMACIST DOSING INPATIENT: Freq: Every day | Status: DC

## 2015-01-01 MED ORDER — ASPIRIN 325 MG PO TABS
325.0000 mg | ORAL_TABLET | Freq: Once | ORAL | Status: AC
  Administered 2015-01-01: 325 mg via ORAL
  Filled 2015-01-01: qty 1

## 2015-01-01 MED ORDER — ATORVASTATIN CALCIUM 20 MG PO TABS
20.0000 mg | ORAL_TABLET | Freq: Every day | ORAL | Status: DC
  Administered 2015-01-02 – 2015-01-06 (×4): 20 mg via ORAL
  Filled 2015-01-01 (×4): qty 1
  Filled 2015-01-01: qty 2
  Filled 2015-01-01: qty 1
  Filled 2015-01-01 (×2): qty 2

## 2015-01-01 MED ORDER — ONDANSETRON HCL 4 MG PO TABS: 4.0000 mg | ORAL_TABLET | Freq: Four times a day (QID) | ORAL | Status: DC | PRN

## 2015-01-01 MED ORDER — METOPROLOL TARTRATE 25 MG PO TABS: 25.0000 mg | ORAL_TABLET | Freq: Two times a day (BID) | ORAL | Status: DC

## 2015-01-01 MED ORDER — SODIUM CHLORIDE 0.9 % IV SOLN: INTRAVENOUS | Status: DC

## 2015-01-01 MED ORDER — LORAZEPAM 2 MG/ML IJ SOLN
0.5000 mg | Freq: Once | INTRAMUSCULAR | Status: AC
  Administered 2015-01-01: 0.5 mg via INTRAVENOUS
  Filled 2015-01-01: qty 1

## 2015-01-01 NOTE — ED Notes (Signed)
Pt Cook MD run 500 bolus and then start another 500 bolus.

## 2015-01-01 NOTE — ED Notes (Addendum)
[name]/son [phone] [name]/daughter-in-law [id number]

## 2015-01-01 NOTE — ED Notes (Addendum)
Gagan aware of tachycardia and PVC/ EKG change. Gagan pt resting/snoring, alert to normal with reposition, and 2 lpm [place] applied. Communication via phone.

## 2015-01-01 NOTE — ED Notes (Signed)
Cook MD aware of tachycardia and PVC/EKG change. Gagan paged.

## 2015-01-01 NOTE — Progress Notes (Signed)
CRITICAL VALUE ALERT  Critical value received:  Troponin 1.11  Date of notification:  01/01/2015  Time of notification:  1321  Critical value read back:yes  Nurse who received alert:  Lorrin JacksonAmy Anahli Arvanitis RN  MD notified (1st page):  [place]  Time of first page:  1321  MD notified (2nd page):n/a  Time of second page:n/a  Responding MD: Dr. Sharl MaLama   Time MD responded:  1324

## 2015-01-01 NOTE — ED Notes (Signed)
Pt has been placed on a bed pan four sperate times since her arrival in the ED dept,and she has not produced anything none of those times.

## 2015-01-01 NOTE — ED Notes (Signed)
Pt agitated attempting to sit up yelling to family and staff. Pt states "I am going home." Pt redirected and repositioned several attempts.

## 2015-01-01 NOTE — ED Notes (Signed)
Cook MD aware of troponin critical value. Per Adriana Simasook MD allow pt to drink ice water as pt requested.

## 2015-01-01 NOTE — ED Notes (Signed)
Pt brought in via ems from [facility]. Per nh staff, when they would go in pt's room they would find pt on the floor. Unknown if pt fell. NH staff also states pt has been having increased episodes of ams. Pt has black eye.

## 2015-01-01 NOTE — ED Notes (Signed)
Bed: [id number] Expected date:  Expected time:  Means of arrival:  Comments: EMS 81yo F; ? Fall, crawling on the floor

## 2015-01-01 NOTE — H&P (Signed)
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 Today patient was brought to the ED after she was found on the floor near her bed at the skilled facility. Patient was then moved to the common area and there also she was found on the floor. Patient was brought to the emergency department for further evaluation. In the ED patient was found to be hypotensive, abnormal UA, mild elevation of troponin 1.09. Patient started on IV fluids, IV Rocephin. As per family patient's mental status is at her baseline, she usually is agitated all the time, patient has underlying dementia and psychiatry at the skilled facility are trying to adjust her medications. Patient denies any pain, wants to go home. There is no history of nausea vomiting or diarrhea. No previous history of MI.  Allergies:   Allergies  Allergen Reactions  . Tuberculin Tests     Unknown per [facility]       Past Medical History  Diagnosis Date  . Glaucoma   . Depression   . Pulmonary embolus   . Psychosis   . Hyperlipidemia   . Vitamin B12 deficiency   . Vitamin D deficiency   . 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 Dementia     History reviewed. No pertinent past surgical history.  Prior to Admission medications   Medication Sig Start Date End Date Taking? Authorizing Provider  acetaminophen (TYLENOL) 500 MG tablet Take 500 mg by mouth every 6 (six) hours as needed for mild pain.    Yes Historical Provider, MD  atorvastatin (LIPITOR) 20 MG tablet Take 20 mg by mouth at bedtime.    Yes Historical Provider, MD  diazepam (VALIUM) 2 MG tablet Take 2 mg by mouth 2 (two) times daily as needed for anxiety.   Yes Historical Provider, MD  diazepam (VALIUM) 5 MG tablet Take 5 mg by mouth every 8  (eight) hours as needed for anxiety.   Yes Historical Provider, MD  docusate sodium (COLACE) 100 MG capsule Take 100 mg by mouth 2 (two) times daily.   Yes Historical Provider, MD  ergocalciferol (VITAMIN D2) 50000 UNITS capsule Take 50,000 Units by mouth once a week.   Yes Historical Provider, MD  HYDROcodone-acetaminophen (NORCO/VICODIN) 5-325 MG per tablet Take 1 tablet by mouth 2 (two) times daily.   Yes Historical Provider, MD  metoprolol tartrate (LOPRESSOR) 25 MG tablet Take 25 mg by mouth 2 (two) times daily.   Yes Historical Provider, MD  mirtazapine (REMERON) 30 MG tablet Take 30 mg by mouth daily. At 1700   Yes Historical Provider, MD  Multiple Vitamin (TAB-A-VITE PO) Take 1 tablet by mouth daily.   Yes Historical Provider, MD  pantoprazole (PROTONIX) 40 MG tablet Take 40 mg by mouth daily.   Yes Historical Provider, MD  polyvinyl alcohol (LIQUIFILM TEARS) 1.4 % ophthalmic solution Place 2 drops into the right eye 4 (four) times daily.   Yes Historical Provider, MD  sertraline (ZOLOFT) 25 MG tablet Take 25 mg by mouth every morning.   Yes Historical Provider, MD  traZODone (DESYREL) 50 MG tablet Take 50 mg by mouth at bedtime.   Yes Historical Provider, MD  vitamin B-12 (CYANOCOBALAMIN) 1000 MCG tablet Take 1,000 mcg by mouth daily.   Yes Historical Provider,  MD  warfarin (COUMADIN) 2.5 MG tablet Take 2.5-5 mg by mouth daily. 2.5 mg daily except on Monday take 5mg    Yes Historical Provider, MD  cephALEXin (KEFLEX) 500 MG capsule Take 1 capsule (500 mg total) by mouth 4 (four) times daily. Patient not taking: Reported on 01/01/2015 11/08/14   Linwood DibblesJon Knapp, MD  traZODone (DESYREL) 50 MG tablet Take 50 mg by mouth at bedtime as needed for sleep.    Historical Provider, MD    Social History:  reports that she does not drink alcohol or use illicit drugs. Her tobacco history is not on file.    All the positives are listed in BOLD  Review of Systems:  HEENT: Headache, blurred vision, runny  nose, sore throat Neck: Hypothyroidism, hyperthyroidism,,lymphadenopathy Chest : Shortness of breath, history of COPD, Asthma Heart : Chest pain, history of coronary arterey disease GI:  Nausea, vomiting, diarrhea, constipation, GERD GU: Dysuria, urgency, frequency of urination, hematuria Neuro: Stroke, seizures, syncope Psych: Depression, anxiety, hallucinations   Physical Exam: Blood pressure 98/86, pulse 92, temperature 98.1 F (36.7 C), temperature source Oral, resp. rate 23, SpO2 89 %. Constitutional:   Patient is a well-developed and well-nourished anxious appearing female, in no acute distress and cooperative with exam. Head: Normocephalic and atraumatic Mouth: Mucus membranes moist Eyes: PERRL, EOMI, conjunctivae normal Neck: Supple, No Thyromegaly Cardiovascular: RRR, S1 normal, S2 normal Pulmonary/Chest: CTAB, no wheezes, rales, or rhonchi Abdominal: Soft. Non-tender, non-distended, bowel sounds are normal, no masses, organomegaly, or guarding present.  Neurological: Alert but not oriented x3, Strength is normal and symmetric bilaterally, cranial nerve II-XII are grossly intact, no focal motor deficit, sensory intact to light touch bilaterally.  Extremities : No Cyanosis, Clubbing or Edema  Labs on Admission:  Basic Metabolic Panel:  Recent Labs Lab 01/01/15 0748  NA 141  K 4.0  CL 112  CO2 20  GLUCOSE 168*  BUN 29*  CREATININE 1.07  CALCIUM 8.9   Liver Function Tests:  Recent Labs Lab 01/01/15 0748  AST 93*  ALT 31  ALKPHOS 85  BILITOT 0.8  PROT 7.3  ALBUMIN 3.5   No results for input(s): LIPASE, AMYLASE in the last 168 hours. No results for input(s): AMMONIA in the last 168 hours. CBC:  Recent Labs Lab 01/01/15 0748  WBC 14.3*  NEUTROABS 12.2*  HGB 12.4  HCT 38.2  MCV 97.9  PLT 161   Cardiac Enzymes:  Recent Labs Lab 01/01/15 0748  TROPONINI 1.09*    BNP (last 3 results) No results for input(s): PROBNP in the last 8760  hours. CBG: No results for input(s): GLUCAP in the last 168 hours.  Radiological Exams on Admission: Ct Head Wo Contrast  01/01/2015   CLINICAL DATA:  Pt unable to answer question unable to hold still  EXAM: CT HEAD WITHOUT CONTRAST  CT MAXILLOFACIAL WITHOUT CONTRAST  TECHNIQUE: Multidetector CT imaging of the head and maxillofacial structures were performed using the standard protocol without intravenous contrast. Multiplanar CT image reconstructions of the maxillofacial structures were also generated.  COMPARISON:  07/11/2014  FINDINGS: CT HEAD FINDINGS  Atherosclerotic and physiologic intracranial calcifications. Diffuse parenchymal atrophy. Patchy areas of progressive hypoattenuation in deep and periventricular white matter bilaterally. Negative for acute intracranial hemorrhage, mass lesion, acute infarction, midline shift, or mass-effect. Acute infarct may be inapparent on noncontrast CT. Ventricles and sulci symmetric. Bone windows demonstrate no focal lesion.  CT MAXILLOFACIAL FINDINGS  Retention cysts or polyps in bilateral maxillary sinuses. Paranasal sinuses are otherwise normally developed and  well aerated. Negative for fracture. Mandible intact. Temporomandibular joints seated. Multiple dental restorations. Orbits and globes unremarkable. Mild spondylitic changes in the visualized lower cervical spine.  IMPRESSION: 1.   1.  Negative for bleed or other acute intracranial process. 2. Atrophy and nonspecific white matter changes. 3. Negative for fracture. 4. Bilateral maxillary sinus disease.   Electronically Signed   By: Oley Balm M.D.   On: 01/01/2015 09:06   Dg Knee Complete 4 Views Left  01/01/2015   CLINICAL DATA:  found on floor at nursing home crawling on floor. Unknown if pt fell. Pain and abrasions on anterior left knee  EXAM: LEFT KNEE - COMPLETE 4+ VIEW  COMPARISON:  None.  FINDINGS: Narrowing of the articular cartilage in the medial compartment with small marginal spurs.  Chondrocalcinosis in medial and lateral compartments. Small marginal spur from the patellar articular surface. Negative for fracture or dislocation. No effusion. Mild diffuse osteopenia.  IMPRESSION: 1. Negative for fracture or other acute bony injury. 2. Degenerative changes most marked in the medial compartment, with chondrocalcinosis suggesting CPPD.   Electronically Signed   By: Oley Balm M.D.   On: 01/01/2015 08:55   Ct Maxillofacial Wo Cm  01/01/2015   CLINICAL DATA:  Pt unable to answer question unable to hold still  EXAM: CT HEAD WITHOUT CONTRAST  CT MAXILLOFACIAL WITHOUT CONTRAST  TECHNIQUE: Multidetector CT imaging of the head and maxillofacial structures were performed using the standard protocol without intravenous contrast. Multiplanar CT image reconstructions of the maxillofacial structures were also generated.  COMPARISON:  07/11/2014  FINDINGS: CT HEAD FINDINGS  Atherosclerotic and physiologic intracranial calcifications. Diffuse parenchymal atrophy. Patchy areas of progressive hypoattenuation in deep and periventricular white matter bilaterally. Negative for acute intracranial hemorrhage, mass lesion, acute infarction, midline shift, or mass-effect. Acute infarct may be inapparent on noncontrast CT. Ventricles and sulci symmetric. Bone windows demonstrate no focal lesion.  CT MAXILLOFACIAL FINDINGS  Retention cysts or polyps in bilateral maxillary sinuses. Paranasal sinuses are otherwise normally developed and well aerated. Negative for fracture. Mandible intact. Temporomandibular joints seated. Multiple dental restorations. Orbits and globes unremarkable. Mild spondylitic changes in the visualized lower cervical spine.  IMPRESSION: 1.   1.  Negative for bleed or other acute intracranial process. 2. Atrophy and nonspecific white matter changes. 3. Negative for fracture. 4. Bilateral maxillary sinus disease.   Electronically Signed   By: Oley Balm M.D.   On: 01/01/2015 09:06    EKG:  Independently reviewed. Normal sinus rhythm   Assessment/Plan Principal Problem:   UTI (lower urinary tract infection) Active Problems:   Recurrent major depression-severe   Sepsis  Sepsis Patient is presenting with hypotension, leukocytosis, tachypnea, tachycardia. With abnormal UA sepsis is due to like UTI. We'll start the patient on Zosyn per pharmacy consultation. Patient received Rocephin in the ED.  Elevated troponin Patient has mild elevation of troponin 1.09, likely due to the demand ischemia as patient was hypotensive and tachycardic when she came to the hospital. I have discussed in detail with patient's son and daughter-in-law at bedside, they would like to observe the serial troponin. And if troponin does become significantly elevated will get cardiology consultation.  Severe anxiety/dementia Patient has history of severe anxiety, agitation she has a history of underlying dementia. Will continue the home medications. If patient continues to be agitated will consider psychiatric evaluation for further adjustment of medications.  History of hypertension When patient came to the ED she was hypotensive, blood pressure has improved after she received  IV fluids. Will hold the metoprolol at this time to prevent hypotension.  History of DVT Patient is on Coumadin, we will get pharmacy consultation for adjustment of the dose of Coumadin.  Code status: Patient is DO NOT RESUSCITATE  Family discussion: Admission, patients condition and plan of care including tests being ordered have been discussed with the patient;s son and daughter-in-law at bedside* who indicate understanding and agree with the plan and Code Status.   Time Spent on Admission: 65 minutes  Breezy Hertenstein S [facility] Pager: [phone] 01/01/2015, 11:34 AM  If 7PM-7AM, please contact night-coverage  [email or web address]  Password [email or web address]

## 2015-01-01 NOTE — Progress Notes (Signed)
ANTIBIOTIC CONSULT NOTE - INITIAL  Pharmacy Consult for zosyn Indication: sepsis  Allergies  Allergen Reactions  . Tuberculin Tests     [facility]     Patient Measurements: Height: 5\' 2"  (157.5 cm) Weight: 137 lb 12.6 oz (62.5 kg) IBW/kg (Calculated) : 50.1 Adjusted Body Weight:   Vital Signs: Temp: 98.6 F (37 C) (01/30 1646) Temp Source: Oral (01/30 1646) BP: 99/49 mmHg (01/30 1646) Pulse Rate: 91 (01/30 1646) Intake/Output from previous day:   Intake/Output from this shift: Total I/O In: 533.3 [I.V.:533.3] Out: 500 [Urine:500]  Labs:  Recent Labs  01/01/15 0748  WBC 14.3*  HGB 12.4  PLT 161  CREATININE 1.07   Estimated Creatinine Clearance: 33.4 mL/min (by C-G formula based on Cr of 1.07). No results for input(s): VANCOTROUGH, VANCOPEAK, VANCORANDOM, GENTTROUGH, GENTPEAK, GENTRANDOM, TOBRATROUGH, TOBRAPEAK, TOBRARND, AMIKACINPEAK, AMIKACINTROU, AMIKACIN in the last 72 hours.   Microbiology: Recent Results (from the past 720 hour(s))  MRSA PCR Screening     Status: Abnormal   Collection Time: 01/01/15  1:40 PM  Result Value Ref Range Status   MRSA by PCR POSITIVE (A) NEGATIVE Final    Comment:        The GeneXpert MRSA Assay (FDA approved for NASAL specimens only), is one component of a comprehensive MRSA colonization surveillance program. It is not intended to diagnose MRSA infection nor to guide or monitor treatment for MRSA infections. RESULT CALLED TO, READ BACK BY AND VERIFIED WITH: RUBY JOHNSON,RN [id number] @ 1728 BY J SCOTTON     Medical History: Past Medical History  Diagnosis Date  . Glaucoma   . Depression   . Pulmonary embolus   . Psychosis   . Hyperlipidemia   . Vitamin B12 deficiency   . Vitamin D deficiency   . 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 Dementia    Assessment: [age] YOF presenting following fall at NH, concern for sepsis so orders for zosyn.   1/30 >> zosyn  >>  Tmax: afeb WBCs: mildy elevated Renal:  SCr WNL  1/30 urine:   Goal of Therapy:  dose for renal function  Plan:   Continue zosyn 3.375gm IV q8h over 4h infusion  NO further adjustment needed at this time  [name], PharmD, BCPS.   Pager: [phone]  01/01/2015,5:42 PM

## 2015-01-01 NOTE — ED Notes (Addendum)
Pt continues to be agitated. Hospitalist aware. Pt request to have BM; assisted with bedpan 4 attempts. Hospitalist at bedside and states use bedside commode. Pt currently sitting on bedside commode denies need to have BM. Pt repositioned in bed for comfort post attempt.

## 2015-01-01 NOTE — ED Provider Notes (Signed)
CSN: [id number]     Arrival date & time 01/01/15  0708 History   First MD Initiated Contact with Patient 01/01/15 [phone]     Chief Complaint  Patient presents with  . Eye Injury  . Altered Mental Status  . possible fall      (Consider location/radiation/quality/duration/timing/severity/associated sxs/prior Treatment) HPI..... Level V caveat for dementia. Patient was found at her memory care unit crawling on the floor this morning. Her face was bruised. She is demented and her family reports normal mental status. No specific complaints of pain. No prodromal illnesses.  Past Medical History  Diagnosis Date  . Glaucoma   . Depression   . Pulmonary embolus   . Psychosis   . Hyperlipidemia   . Vitamin B12 deficiency   . Vitamin D deficiency   . 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 Dementia    History reviewed. No pertinent past surgical history. History reviewed. No pertinent family history. History  Substance Use Topics  . Smoking status: Unknown If Ever Smoked  . Smokeless tobacco: Not on file  . Alcohol Use: No   OB History    Gravida Para Term Preterm AB TAB SAB Ectopic Multiple Living   0 0 0 0 0 0 0 0       Review of Systems  Unable to perform ROS: Dementia      Allergies  Tuberculin tests  Home Medications   Prior to Admission medications   Medication Sig Start Date End Date Taking? Authorizing Provider  acetaminophen (TYLENOL) 500 MG tablet Take 500 mg by mouth every 6 (six) hours as needed for mild pain.    Yes Historical Provider, MD  atorvastatin (LIPITOR) 20 MG tablet Take 20 mg by mouth at bedtime.    Yes Historical Provider, MD  diazepam (VALIUM) 2 MG tablet Take 2 mg by mouth 2 (two) times daily as needed for anxiety.   Yes Historical Provider, MD  diazepam (VALIUM) 5 MG tablet Take 5 mg by mouth every 8 (eight) hours as needed for anxiety.   Yes Historical Provider, MD  docusate sodium (COLACE) 100 MG capsule Take 100 mg by mouth 2 (two) times  daily.   Yes Historical Provider, MD  ergocalciferol (VITAMIN D2) 50000 UNITS capsule Take 50,000 Units by mouth once a week.   Yes Historical Provider, MD  HYDROcodone-acetaminophen (NORCO/VICODIN) 5-325 MG per tablet Take 1 tablet by mouth 2 (two) times daily.   Yes Historical Provider, MD  metoprolol tartrate (LOPRESSOR) 25 MG tablet Take 25 mg by mouth 2 (two) times daily.   Yes Historical Provider, MD  mirtazapine (REMERON) 30 MG tablet Take 30 mg by mouth daily. At 1700   Yes Historical Provider, MD  Multiple Vitamin (TAB-A-VITE PO) Take 1 tablet by mouth daily.   Yes Historical Provider, MD  pantoprazole (PROTONIX) 40 MG tablet Take 40 mg by mouth daily.   Yes Historical Provider, MD  polyvinyl alcohol (LIQUIFILM TEARS) 1.4 % ophthalmic solution Place 2 drops into the right eye 4 (four) times daily.   Yes Historical Provider, MD  sertraline (ZOLOFT) 25 MG tablet Take 25 mg by mouth every morning.   Yes Historical Provider, MD  traZODone (DESYREL) 50 MG tablet Take 50 mg by mouth at bedtime.   Yes Historical Provider, MD  vitamin B-12 (CYANOCOBALAMIN) 1000 MCG tablet Take 1,000 mcg by mouth daily.   Yes Historical Provider, MD  warfarin (COUMADIN) 2.5 MG tablet Take 2.5-5 mg by mouth daily. 2.5 mg  daily except on Monday take 5mg    Yes Historical Provider, MD  cephALEXin (KEFLEX) 500 MG capsule Take 1 capsule (500 mg total) by mouth 4 (four) times daily. Patient not taking: Reported on 01/01/2015 11/08/14   Linwood DibblesJon Knapp, MD  traZODone (DESYREL) 50 MG tablet Take 50 mg by mouth at bedtime as needed for sleep.    Historical Provider, MD   BP 112/70 mmHg  Pulse 98  Temp(Src) 98.1 F (36.7 C) (Oral)  Resp 19  SpO2 96%  LMP  (LMP Unknown) Physical Exam  Constitutional:  Demented, repetitive speech pattern  HENT:  Head: Normocephalic.  Bruising around the eye sockets  Eyes: Conjunctivae and EOM are normal. Pupils are equal, round, and reactive to light.  Neck: Normal range of motion. Neck  supple.  Cardiovascular: Normal rate and regular rhythm.   Pulmonary/Chest: Effort normal and breath sounds normal.  Abdominal: Soft. Bowel sounds are normal.  Musculoskeletal:  Puffy knees but nontender, left greater than right  Neurological:  Conversant, demented  Skin: Skin is warm and dry.  Psychiatric:  Flat affect  Nursing note and vitals reviewed.   ED Course  Procedures (including critical care time) Labs Review Labs Reviewed  CBC WITH DIFFERENTIAL/PLATELET - Abnormal; Notable for the following:    WBC 14.3 (*)    Neutrophils Relative % 86 (*)    Neutro Abs 12.2 (*)    Lymphocytes Relative 5 (*)    Monocytes Absolute 1.3 (*)    All other components within normal limits  COMPREHENSIVE METABOLIC PANEL - Abnormal; Notable for the following:    Glucose, Bld 168 (*)    BUN 29 (*)    AST 93 (*)    GFR calc non Af Amer 46 (*)    GFR calc Af Amer 53 (*)    All other components within normal limits  TROPONIN I - Abnormal; Notable for the following:    Troponin I 1.09 (*)    All other components within normal limits  URINALYSIS, ROUTINE W REFLEX MICROSCOPIC - Abnormal; Notable for the following:    APPearance CLOUDY (*)    Hgb urine dipstick LARGE (*)    Protein, ur 100 (*)    Leukocytes, UA MODERATE (*)    All other components within normal limits  URINE MICROSCOPIC-ADD ON - Abnormal; Notable for the following:    Bacteria, UA MANY (*)    All other components within normal limits  URINE CULTURE    Imaging Review Ct Head Wo Contrast  01/01/2015   CLINICAL DATA:  Pt unable to answer question unable to hold still  EXAM: CT HEAD WITHOUT CONTRAST  CT MAXILLOFACIAL WITHOUT CONTRAST  TECHNIQUE: Multidetector CT imaging of the head and maxillofacial structures were performed using the standard protocol without intravenous contrast. Multiplanar CT image reconstructions of the maxillofacial structures were also generated.  COMPARISON:  07/11/2014  FINDINGS: CT HEAD FINDINGS   Atherosclerotic and physiologic intracranial calcifications. Diffuse parenchymal atrophy. Patchy areas of progressive hypoattenuation in deep and periventricular white matter bilaterally. Negative for acute intracranial hemorrhage, mass lesion, acute infarction, midline shift, or mass-effect. Acute infarct may be inapparent on noncontrast CT. Ventricles and sulci symmetric. Bone windows demonstrate no focal lesion.  CT MAXILLOFACIAL FINDINGS  Retention cysts or polyps in bilateral maxillary sinuses. Paranasal sinuses are otherwise normally developed and well aerated. Negative for fracture. Mandible intact. Temporomandibular joints seated. Multiple dental restorations. Orbits and globes unremarkable. Mild spondylitic changes in the visualized lower cervical spine.  IMPRESSION: 1.  1.  Negative for bleed or other acute intracranial process. 2. Atrophy and nonspecific white matter changes. 3. Negative for fracture. 4. Bilateral maxillary sinus disease.   Electronically Signed   By: Oley Balm M.D.   On: 01/01/2015 09:06   Dg Knee Complete 4 Views Left  01/01/2015   CLINICAL DATA:  found on floor at nursing home crawling on floor. Unknown if pt fell. Pain and abrasions on anterior left knee  EXAM: LEFT KNEE - COMPLETE 4+ VIEW  COMPARISON:  None.  FINDINGS: Narrowing of the articular cartilage in the medial compartment with small marginal spurs. Chondrocalcinosis in medial and lateral compartments. Small marginal spur from the patellar articular surface. Negative for fracture or dislocation. No effusion. Mild diffuse osteopenia.  IMPRESSION: 1. Negative for fracture or other acute bony injury. 2. Degenerative changes most marked in the medial compartment, with chondrocalcinosis suggesting CPPD.   Electronically Signed   By: Oley Balm M.D.   On: 01/01/2015 08:55   Ct Maxillofacial Wo Cm  01/01/2015   CLINICAL DATA:  Pt unable to answer question unable to hold still  EXAM: CT HEAD WITHOUT CONTRAST  CT  MAXILLOFACIAL WITHOUT CONTRAST  TECHNIQUE: Multidetector CT imaging of the head and maxillofacial structures were performed using the standard protocol without intravenous contrast. Multiplanar CT image reconstructions of the maxillofacial structures were also generated.  COMPARISON:  07/11/2014  FINDINGS: CT HEAD FINDINGS  Atherosclerotic and physiologic intracranial calcifications. Diffuse parenchymal atrophy. Patchy areas of progressive hypoattenuation in deep and periventricular white matter bilaterally. Negative for acute intracranial hemorrhage, mass lesion, acute infarction, midline shift, or mass-effect. Acute infarct may be inapparent on noncontrast CT. Ventricles and sulci symmetric. Bone windows demonstrate no focal lesion.  CT MAXILLOFACIAL FINDINGS  Retention cysts or polyps in bilateral maxillary sinuses. Paranasal sinuses are otherwise normally developed and well aerated. Negative for fracture. Mandible intact. Temporomandibular joints seated. Multiple dental restorations. Orbits and globes unremarkable. Mild spondylitic changes in the visualized lower cervical spine.  IMPRESSION: 1.   1.  Negative for bleed or other acute intracranial process. 2. Atrophy and nonspecific white matter changes. 3. Negative for fracture. 4. Bilateral maxillary sinus disease.   Electronically Signed   By: Oley Balm M.D.   On: 01/01/2015 09:06     EKG Interpretation   Date/Time:  Saturday January 01 2015 07:35:06 EST Ventricular Rate:  99 PR Interval:  162 QRS Duration: 88 QT Interval:  378 QTC Calculation: 485 R Axis:   26 Text Interpretation:  Sinus tachycardia Multiple ventricular premature  complexes Borderline prolonged QT interval Confirmed by Adriana Simas  MD, Jahrel Borthwick  [id number]) on 01/01/2015 9:11:39 AM     CRITICAL CARE Performed by: Donnetta Hutching Total critical care time: 30 Critical care time was exclusive of separately billable procedures and treating other patients. Critical care was necessary to  treat or prevent imminent or life-threatening deterioration. Critical care was time spent personally by me on the following activities: development of treatment plan with patient and/or surrogate as well as nursing, discussions with consultants, evaluation of patient's response to treatment, examination of patient, obtaining history from patient or surrogate, ordering and performing treatments and interventions, ordering and review of laboratory studies, ordering and review of radiographic studies, pulse oximetry and re-evaluation of patient's condition. MDM   Final diagnoses:  Fall  UTI (lower urinary tract infection)  Elevated troponin    Patient appears dehydrated. Will hydrate. Urinary tract infection noted. IV Rocephin. Urine culture. Elevated troponin noted. EKG shows no evidence of acute  MI. Admit to general medicine    Donnetta Hutching, MD 01/01/15 1022

## 2015-01-01 NOTE — ED Notes (Signed)
Pt resting at present time. Pt oxygen saturation 89-92% on RA. Pt reposition in semi fowlers and 2 lpm [place] applied.

## 2015-01-01 NOTE — ED Notes (Signed)
Family at bedside reports mental status pt normal.

## 2015-01-01 NOTE — Progress Notes (Signed)
ANTICOAGULATION CONSULT NOTE - Initial Consult  Pharmacy Consult for warfarin  Indication: history of PE  Allergies  Allergen Reactions  . Tuberculin Tests     [facility]     Patient Measurements:   Heparin Dosing Weight:  Vital Signs: Temp: 98.1 F (36.7 C) (01/30 0722) Temp Source: Oral (01/30 0722) BP: 98/86 mmHg (01/30 1131) Pulse Rate: 87 (01/30 1141)  Labs:  Recent Labs  01/01/15 0748  HGB 12.4  HCT 38.2  PLT 161  CREATININE 1.07  TROPONINI 1.09*    CrCl cannot be calculated (Unknown ideal weight.).   Medical History: Past Medical History  Diagnosis Date  . Glaucoma   . Depression   . Pulmonary embolus   . Psychosis   . Hyperlipidemia   . Vitamin B12 deficiency   . Vitamin D deficiency   . 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 Dementia     Assessment: [name].  She has h/o dementia and on warfarin for h/o PE.   CT head and maxillofacial negative for bleed. Pharmacy asked to dose warfarin  PTA warfarin dosing:  2.5mg  daily with 5mg  on Mondays (last dose 1/29 17:00) Today, 01/01/2015:   INR = 1.66, subtherapeutic  CBC: Hgb and pltc WNL  Drug - drug interactions:  No major interactions  Diet:  Regular  Goal of Therapy:  INR 2-3 Monitor platelets by anticoagulation protocol: Yes   Plan:   INR subtherapeutic, give warfarin 4mg  PO x 1  Daily INR  [name], PharmD, BCPS.   Pager: [phone]  01/01/2015,11:45 AM

## 2015-01-02 ENCOUNTER — Encounter (HOSPITAL_COMMUNITY): Payer: Self-pay

## 2015-01-02 LAB — COMPREHENSIVE METABOLIC PANEL
ALT: 40 U/L — ABNORMAL HIGH (ref 0–35)
AST: 94 U/L — AB (ref 0–37)
Albumin: 3 g/dL — ABNORMAL LOW (ref 3.5–5.2)
Alkaline Phosphatase: 68 U/L (ref 39–117)
Anion gap: 6 (ref 5–15)
BUN: 20 mg/dL (ref 6–23)
CHLORIDE: 115 mmol/L — AB (ref 96–112)
CO2: 23 mmol/L (ref 19–32)
Calcium: 8.1 mg/dL — ABNORMAL LOW (ref 8.4–10.5)
Creatinine, Ser: 0.84 mg/dL (ref 0.50–1.10)
GFR calc Af Amer: 71 mL/min — ABNORMAL LOW (ref >90)
GFR calc non Af Amer: 62 mL/min — ABNORMAL LOW (ref >90)
Glucose, Bld: 110 mg/dL — ABNORMAL HIGH (ref 70–99)
POTASSIUM: 3.6 mmol/L (ref 3.5–5.1)
SODIUM: 144 mmol/L (ref 135–145)
Total Bilirubin: 0.9 mg/dL (ref 0.3–1.2)
Total Protein: 6.1 g/dL (ref 6.0–8.3)

## 2015-01-02 LAB — PROTIME-INR
INR: 1.95 — ABNORMAL HIGH (ref 0.00–1.49)
PROTHROMBIN TIME: 22.4 s — AB (ref 11.6–15.2)

## 2015-01-02 LAB — CBC
HCT: 32.9 % — ABNORMAL LOW (ref 36.0–46.0)
Hemoglobin: 10.5 g/dL — ABNORMAL LOW (ref 12.0–15.0)
MCH: 31.3 pg (ref 26.0–34.0)
MCHC: 31.9 g/dL (ref 30.0–36.0)
MCV: 98.2 fL (ref 78.0–100.0)
Platelets: 124 10*3/uL — ABNORMAL LOW (ref 150–400)
RBC: 3.35 MIL/uL — ABNORMAL LOW (ref 3.87–5.11)
RDW: 14.5 % (ref 11.5–15.5)
WBC: 8.1 10*3/uL (ref 4.0–10.5)

## 2015-01-02 LAB — TROPONIN I: TROPONIN I: 0.41 ng/mL — AB (ref <0.031)

## 2015-01-02 MED ORDER — WARFARIN SODIUM 3 MG PO TABS
3.0000 mg | ORAL_TABLET | Freq: Once | ORAL | Status: AC
  Administered 2015-01-02: 3 mg via ORAL
  Filled 2015-01-02: qty 1

## 2015-01-02 MED ORDER — METOPROLOL TARTRATE 25 MG PO TABS
25.0000 mg | ORAL_TABLET | Freq: Two times a day (BID) | ORAL | Status: DC
  Administered 2015-01-02 – 2015-01-04 (×4): 25 mg via ORAL
  Filled 2015-01-02 (×8): qty 1

## 2015-01-02 MED ORDER — LORAZEPAM 2 MG/ML IJ SOLN
2.0000 mg | INTRAMUSCULAR | Status: DC | PRN
  Administered 2015-01-02 – 2015-01-03 (×5): 2 mg via INTRAVENOUS
  Filled 2015-01-02 (×5): qty 1

## 2015-01-02 NOTE — Progress Notes (Signed)
[facility] PROGRESS NOTE  [name] WUJ:[id number] DOB: 12-31-1926 DOA: 01/01/2015 PCP: PROVIDER NOT IN SYSTEM  Assessment/Plan: 1. Sepsis- patient presented with hypotension, leukocytosis, tachypnea, tachycardia with abnormal UA. Started on IV Zosyn for UTI, she has improved. Urine culture is pending. Continue IV Zosyn. 2. UTI- patient was found to have abnormal UA, started on IV Zosyn as above. Follow urine culture results. 3. Elevated troponin- likely due to demand ischemia from tachycardia and hypotension. Troponin is now coming down 0.67. EKG shows no acute changes except sinus tachycardia with PVCs. 4. History of DVT- continue Coumadin per pharmacy consultation 5. Severe anxiety/dementia- patient is having agitation this morning, will start Ativan 2 mg IV every 4 hours when necessary 6. History of hypertension- metoprolol was held yesterday due to hypotension, blood pressure has not improved so we'll restart the metoprolol as patient is displaying sinus tachycardia with multiple PVCs on the EKG.   Code Status: DNR Family Communication: *No family at bedside Disposition Plan: SNF   Consultants:  None  Procedures:  None  Antibiotics:  Zosyn  HPI/Subjective: 81 yr old female from skilled facility with a history of dementia, pulmonary embolism, hyperlipidemia who was brought to the hospital after she was found on the floor. In the ED patient was found to have abnormal UA, hypertension, mild elevation of troponin 1.09 so patient was admitted for treatment of UTI. Patient feels better this morning, third troponin is 0.67.   Objective: Filed Vitals:   01/02/15 1150  BP: 123/61  Pulse: 51  Temp:   Resp:     Intake/Output Summary (Last 24 hours) at 01/02/15 1348 Last data filed at 01/02/15 1000  Gross per 24 hour  Intake   1950 ml  Output    450 ml  Net   1500 ml   Filed Weights   01/01/15 1400  Weight: 62.5 kg (137 lb 12.6 oz)    Exam:  Physical  Exam: Eyes: No icterus, extraocular muscles intact  Lungs: Normal respiratory effort, bilateral clear to auscultation, no crackles or wheezes.  Heart: Regular rate and rhythm, S1 and S2 normal, no murmurs, rubs auscultated Abdomen: BS normoactive,soft,nondistended,non-tender to palpation,no organomegaly Extremities: No pretibial edema, no erythema, no cyanosis, no clubbing Neuro : Alert and oriented to time, place and person, No focal deficits   Data Reviewed: Basic Metabolic Panel:  Recent Labs Lab 01/01/15 0748 01/02/15 0711  NA 141 144  K 4.0 3.6  CL 112 115*  CO2 20 23  GLUCOSE 168* 110*  BUN 29* 20  CREATININE 1.07 0.84  CALCIUM 8.9 8.1*   Liver Function Tests:  Recent Labs Lab 01/01/15 0748 01/02/15 0711  AST 93* 94*  ALT 31 40*  ALKPHOS 85 68  BILITOT 0.8 0.9  PROT 7.3 6.1  ALBUMIN 3.5 3.0*   No results for input(s): LIPASE, AMYLASE in the last 168 hours. No results for input(s): AMMONIA in the last 168 hours. CBC:  Recent Labs Lab 01/01/15 0748 01/02/15 0711  WBC 14.3* 8.1  NEUTROABS 12.2*  --   HGB 12.4 10.5*  HCT 38.2 32.9*  MCV 97.9 98.2  PLT 161 124*   Cardiac Enzymes:  Recent Labs Lab 01/01/15 0037 01/01/15 0748 01/01/15 1143 01/01/15 1815  TROPONINI 0.41* 1.09* 1.11* 0.67*   BNP (last 3 results) No results for input(s): PROBNP in the last 8760 hours. CBG: No results for input(s): GLUCAP in the last 168 hours.  Recent Results (from the past 240 hour(s))  MRSA PCR Screening  Status: Abnormal   Collection Time: 01/01/15  1:40 PM  Result Value Ref Range Status   MRSA by PCR POSITIVE (A) NEGATIVE Final    Comment:        The GeneXpert MRSA Assay (FDA approved for NASAL specimens only), is one component of a comprehensive MRSA colonization surveillance program. It is not intended to diagnose MRSA infection nor to guide or monitor treatment for MRSA infections. RESULT CALLED TO, READ BACK BY AND VERIFIED WITH: Octaviano Batty [id number] @ 1728 BY J SCOTTON      Studies: Ct Head Wo Contrast  01/01/2015   CLINICAL DATA:  Pt unable to answer question unable to hold still  EXAM: CT HEAD WITHOUT CONTRAST  CT MAXILLOFACIAL WITHOUT CONTRAST  TECHNIQUE: Multidetector CT imaging of the head and maxillofacial structures were performed using the standard protocol without intravenous contrast. Multiplanar CT image reconstructions of the maxillofacial structures were also generated.  COMPARISON:  07/11/2014  FINDINGS: CT HEAD FINDINGS  Atherosclerotic and physiologic intracranial calcifications. Diffuse parenchymal atrophy. Patchy areas of progressive hypoattenuation in deep and periventricular white matter bilaterally. Negative for acute intracranial hemorrhage, mass lesion, acute infarction, midline shift, or mass-effect. Acute infarct may be inapparent on noncontrast CT. Ventricles and sulci symmetric. Bone windows demonstrate no focal lesion.  CT MAXILLOFACIAL FINDINGS  Retention cysts or polyps in bilateral maxillary sinuses. Paranasal sinuses are otherwise normally developed and well aerated. Negative for fracture. Mandible intact. Temporomandibular joints seated. Multiple dental restorations. Orbits and globes unremarkable. Mild spondylitic changes in the visualized lower cervical spine.  IMPRESSION: 1.   1.  Negative for bleed or other acute intracranial process. 2. Atrophy and nonspecific white matter changes. 3. Negative for fracture. 4. Bilateral maxillary sinus disease.   Electronically Signed   By: Oley Balm M.D.   On: 01/01/2015 09:06   Dg Knee Complete 4 Views Left  01/01/2015   CLINICAL DATA:  found on floor at nursing home crawling on floor. Unknown if pt fell. Pain and abrasions on anterior left knee  EXAM: LEFT KNEE - COMPLETE 4+ VIEW  COMPARISON:  None.  FINDINGS: Narrowing of the articular cartilage in the medial compartment with small marginal spurs. Chondrocalcinosis in medial and lateral compartments.  Small marginal spur from the patellar articular surface. Negative for fracture or dislocation. No effusion. Mild diffuse osteopenia.  IMPRESSION: 1. Negative for fracture or other acute bony injury. 2. Degenerative changes most marked in the medial compartment, with chondrocalcinosis suggesting CPPD.   Electronically Signed   By: Oley Balm M.D.   On: 01/01/2015 08:55   Ct Maxillofacial Wo Cm  01/01/2015   CLINICAL DATA:  Pt unable to answer question unable to hold still  EXAM: CT HEAD WITHOUT CONTRAST  CT MAXILLOFACIAL WITHOUT CONTRAST  TECHNIQUE: Multidetector CT imaging of the head and maxillofacial structures were performed using the standard protocol without intravenous contrast. Multiplanar CT image reconstructions of the maxillofacial structures were also generated.  COMPARISON:  07/11/2014  FINDINGS: CT HEAD FINDINGS  Atherosclerotic and physiologic intracranial calcifications. Diffuse parenchymal atrophy. Patchy areas of progressive hypoattenuation in deep and periventricular white matter bilaterally. Negative for acute intracranial hemorrhage, mass lesion, acute infarction, midline shift, or mass-effect. Acute infarct may be inapparent on noncontrast CT. Ventricles and sulci symmetric. Bone windows demonstrate no focal lesion.  CT MAXILLOFACIAL FINDINGS  Retention cysts or polyps in bilateral maxillary sinuses. Paranasal sinuses are otherwise normally developed and well aerated. Negative for fracture. Mandible intact. Temporomandibular joints seated. Multiple dental restorations. Orbits and  globes unremarkable. Mild spondylitic changes in the visualized lower cervical spine.  IMPRESSION: 1.   1.  Negative for bleed or other acute intracranial process. 2. Atrophy and nonspecific white matter changes. 3. Negative for fracture. 4. Bilateral maxillary sinus disease.   Electronically Signed   By: Oley Balmaniel  Hassell M.D.   On: 01/01/2015 09:06    Scheduled Meds: . atorvastatin  20 mg Oral QHS  .  docusate sodium  100 mg Oral BID  . HYDROcodone-acetaminophen  1 tablet Oral BID  . latanoprost  1 drop Both Eyes QHS  . mirtazapine  30 mg Oral QHS  . pantoprazole  40 mg Oral Daily  . piperacillin-tazobactam (ZOSYN)  IV  3.375 g Intravenous 3 times per day  . polyvinyl alcohol  1 drop Right Eye BID  . sertraline  25 mg Oral BH-q7a  . traZODone  50 mg Oral QHS  . warfarin  3 mg Oral ONCE-1800  . Warfarin - Pharmacist Dosing Inpatient   Does not apply q1800   Continuous Infusions: . sodium chloride 125 mL/hr at 01/02/15 0000    Principal Problem:   UTI (lower urinary tract infection) Active Problems:   Recurrent major depression-severe   Sepsis    Time spent: 20 min    [facility],Jamila Slatten S  [facility] Pager [phone]. If 7PM-7AM, please contact night-coverage at [email or web address], password [email or web address] 01/02/2015, 1:48 PM  LOS: 1 day

## 2015-01-02 NOTE — Progress Notes (Signed)
ANTICOAGULATION CONSULT NOTE - follow up  Pharmacy Consult for warfarin  Indication: history of PE  Allergies  Allergen Reactions  . Tuberculin Tests     [facility]     Patient Measurements: Height: 5\' 2"  (157.5 cm) Weight: 137 lb 12.6 oz (62.5 kg) IBW/kg (Calculated) : 50.1 Heparin Dosing Weight:  Vital Signs: Temp: 98.1 F (36.7 C) (01/31 0741) Temp Source: Oral (01/31 0741) BP: 90/52 mmHg (01/31 0100) Pulse Rate: 97 (01/31 0100)  Labs:  Recent Labs  01/01/15 0748 01/01/15 1143 01/01/15 1815 01/02/15 0711  HGB 12.4  --   --  10.5*  HCT 38.2  --   --  32.9*  PLT 161  --   --  124*  LABPROT  --  19.8*  --  22.4*  INR  --  1.66*  --  1.95*  CREATININE 1.07  --   --  0.84  TROPONINI 1.09* 1.11* 0.67*  --     Estimated Creatinine Clearance: 42.6 mL/min (by C-G formula based on Cr of 0.84).   Medical History: Past Medical History  Diagnosis Date  . Glaucoma   . Depression   . Pulmonary embolus   . Psychosis   . Hyperlipidemia   . Vitamin B12 deficiency   . Vitamin D deficiency   . 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 Dementia     Assessment: [age] [facility]  She has h/o dementia and on warfarin for h/o PE.   CT head and maxillofacial negative for bleed. Pharmacy asked to dose warfarin  PTA warfarin dosing:  2.5mg  daily with 5mg  on Mondays   Today, 01/02/2015:   INR = 1.95, subtherapeutic  CBC: Hgb and pltc low  Drug - drug interactions:  No major interactions  Diet:  Regular  Goal of Therapy:  INR 2-3 Monitor platelets by anticoagulation protocol: Yes   Plan:   INR subtherapeutic, give warfarin 3mg  PO x 1  Daily INR  Arley PhenixEllen Phillippa Straub RPh 01/02/2015, 8:28 AM Pager [phone]

## 2015-01-03 LAB — BASIC METABOLIC PANEL
Anion gap: 4 — ABNORMAL LOW (ref 5–15)
BUN: 17 mg/dL (ref 6–23)
CALCIUM: 7.9 mg/dL — AB (ref 8.4–10.5)
CO2: 20 mmol/L (ref 19–32)
Chloride: 117 mmol/L — ABNORMAL HIGH (ref 96–112)
Creatinine, Ser: 0.8 mg/dL (ref 0.50–1.10)
GFR calc Af Amer: 76 mL/min — ABNORMAL LOW (ref >90)
GFR calc non Af Amer: 65 mL/min — ABNORMAL LOW (ref >90)
GLUCOSE: 109 mg/dL — AB (ref 70–99)
POTASSIUM: 3.9 mmol/L (ref 3.5–5.1)
Sodium: 141 mmol/L (ref 135–145)

## 2015-01-03 LAB — CBC
HCT: 32.8 % — ABNORMAL LOW (ref 36.0–46.0)
Hemoglobin: 10.3 g/dL — ABNORMAL LOW (ref 12.0–15.0)
MCH: 31.6 pg (ref 26.0–34.0)
MCHC: 31.4 g/dL (ref 30.0–36.0)
MCV: 100.6 fL — ABNORMAL HIGH (ref 78.0–100.0)
PLATELETS: 121 10*3/uL — AB (ref 150–400)
RBC: 3.26 MIL/uL — AB (ref 3.87–5.11)
RDW: 14.5 % (ref 11.5–15.5)
WBC: 6.1 10*3/uL (ref 4.0–10.5)

## 2015-01-03 LAB — PROTIME-INR
INR: 2.86 — AB (ref 0.00–1.49)
PROTHROMBIN TIME: 30.2 s — AB (ref 11.6–15.2)

## 2015-01-03 MED ORDER — CHLORHEXIDINE GLUCONATE CLOTH 2 % EX PADS
6.0000 | MEDICATED_PAD | Freq: Every day | CUTANEOUS | Status: DC
  Administered 2015-01-03: 6 via TOPICAL

## 2015-01-03 MED ORDER — WARFARIN 0.5 MG HALF TABLET
0.5000 mg | ORAL_TABLET | Freq: Once | ORAL | Status: AC
  Administered 2015-01-03: 0.5 mg via ORAL
  Filled 2015-01-03: qty 1

## 2015-01-03 MED ORDER — ENSURE COMPLETE PO LIQD
237.0000 mL | Freq: Three times a day (TID) | ORAL | Status: DC
  Administered 2015-01-04 – 2015-01-07 (×3): 237 mL via ORAL

## 2015-01-03 MED ORDER — MUPIROCIN 2 % EX OINT
1.0000 "application " | TOPICAL_OINTMENT | Freq: Two times a day (BID) | CUTANEOUS | Status: DC
  Administered 2015-01-03 – 2015-01-07 (×9): 1 via NASAL
  Filled 2015-01-03 (×2): qty 22

## 2015-01-03 NOTE — Progress Notes (Signed)
INITIAL NUTRITION ASSESSMENT  DOCUMENTATION CODES Per approved criteria  -Not Applicable   INTERVENTION: - Ensure Complete po TID, each supplement provides 350 kcal and 13 grams of protein - RD will continue to monitor  NUTRITION DIAGNOSIS: Inadequate oral intake related to agitation and confusion as evidenced by poor po.   Goal: Pt to meet >/= 90% of their estimated nutrition needs   Monitor:  Weight trend, po intake, acceptance of supplements, labs  Reason for Assessment: MST  81 y.o. female  Admitting Dx: UTI (lower urinary tract infection)  ASSESSMENT: Today patient was brought to the ED after she was found on the floor near her bed at the skilled facility. Patient was then moved to the common area and there also she was found on the floor. Patient was brought to the emergency department for further evaluation.  - Pt with sepsis related to UTI. Has severe anxiety and dementia.  - Pt confused and agitated during RD visit. Nutritional history obtained from chart and speaking with Nurse Tech.  - Pt ate bites of [place], dressing, and potatoes for lunch. Overall intake was 10%.  - No weight history in chart.  - Limited nutrition-focused physical exam performed with no signs of fat or muscle wasting.   Labs reviewed  Height: Ht Readings from Last 1 Encounters:  01/01/15 5\' 2"  (1.575 m)    Weight: Wt Readings from Last 1 Encounters:  01/01/15 137 lb 12.6 oz (62.5 kg)    Ideal Body Weight: 50.1 kg  % Ideal Body Weight: 125%  Wt Readings from Last 10 Encounters:  01/01/15 137 lb 12.6 oz (62.5 kg)   BMI:  Body mass index is 25.2 kg/(m^2).  Estimated Nutritional Needs: Kcal: 1600-1800 Protein: 80-90 g Fluid: 1.8 L/day  Skin: Intact  Diet Order: Diet regular  EDUCATION NEEDS: -Education needs addressed   Intake/Output Summary (Last 24 hours) at 01/03/15 1504 Last data filed at 01/03/15 1424  Gross per 24 hour  Intake 3398.33 ml  Output      0 ml  Net  3398.33 ml    Last BM: 1/31   Labs:   Recent Labs Lab 01/01/15 0748 01/02/15 0711 01/03/15 0637  NA 141 144 141  K 4.0 3.6 3.9  CL 112 115* 117*  CO2 20 23 20   BUN 29* 20 17  CREATININE 1.07 0.84 0.80  CALCIUM 8.9 8.1* 7.9*  GLUCOSE 168* 110* 109*    CBG (last 3)  No results for input(s): GLUCAP in the last 72 hours.  Scheduled Meds: . atorvastatin  20 mg Oral QHS  . Chlorhexidine Gluconate Cloth  6 each Topical Q0600  . docusate sodium  100 mg Oral BID  . HYDROcodone-acetaminophen  1 tablet Oral BID  . latanoprost  1 drop Both Eyes QHS  . metoprolol tartrate  25 mg Oral BID  . mirtazapine  30 mg Oral QHS  . mupirocin ointment  1 application Nasal BID  . pantoprazole  40 mg Oral Daily  . piperacillin-tazobactam (ZOSYN)  IV  3.375 g Intravenous 3 times per day  . polyvinyl alcohol  1 drop Right Eye BID  . sertraline  25 mg Oral BH-q7a  . traZODone  50 mg Oral QHS  . warfarin  0.5 mg Oral ONCE-1800  . Warfarin - Pharmacist Dosing Inpatient   Does not apply q1800    Continuous Infusions: . sodium chloride 50 mL/hr at 01/03/15 [id number]    Past Medical History  Diagnosis Date  . Glaucoma   .  Depression   . Pulmonary embolus   . Psychosis   . Hyperlipidemia   . Vitamin B12 deficiency   . Vitamin D deficiency   . 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 Dementia     History reviewed. No pertinent past surgical history.  Emmaline Kluver MS, RD, LDN

## 2015-01-03 NOTE — Progress Notes (Signed)
[facility] PROGRESS NOTE  Lang SnowJeanette Delph EAV:[id number] DOB: [birth year] DOA: 01/01/2015 PCP: PROVIDER NOT IN SYSTEM  Assessment/Plan: 1. Sepsis- patient presented with hypotension, leukocytosis, tachypnea, tachycardia with abnormal UA. Started on IV Zosyn for UTI, she has improved. Urine culture is pending. Continue IV Zosyn. 2. UTI- patient was found to have abnormal UA, started on IV Zosyn as above. Follow urine culture results. 3. Elevated troponin- likely due to demand ischemia from tachycardia and hypotension. Troponin is now coming down 0.67. EKG shows no acute changes except sinus tachycardia with PVCs. 4. History of DVT- continue Coumadin per pharmacy consultation 5. Severe anxiety/dementia- patient is more calm today, continue with ativan 2 mg IV q 4 hr prn 6. History of hypertension- metoprolol was held yesterday due to hypotension, blood pressure has not improved so we'll restart the metoprolol as patient is displaying sinus tachycardia with multiple PVCs on the EKG.   Code Status: DNR Family Communication: *Called and discussed with son on phone Disposition Plan: SNF   Consultants:  None  Procedures:  None  Antibiotics:  Zosyn  HPI/Subjective: 81 yr old female from skilled facility with a history of dementia, pulmonary embolism, hyperlipidemia who was brought to the hospital after she was found on the floor. In the ED patient was found to have abnormal UA, hypertension, mild elevation of troponin 1.09 so patient was admitted for treatment of UTI. Patient feels better this morning.  Objective: Filed Vitals:   01/03/15 1200  BP: 111/38  Pulse: 47  Temp:   Resp:     Intake/Output Summary (Last 24 hours) at 01/03/15 1525 Last data filed at 01/03/15 1424  Gross per 24 hour  Intake 3398.33 ml  Output      0 ml  Net 3398.33 ml   Filed Weights   01/01/15 1400  Weight: 62.5 kg (137 lb 12.6 oz)    Exam:  Physical Exam: Eyes: No icterus,  extraocular muscles intact  Lungs: Normal respiratory effort, bilateral clear to auscultation, no crackles or wheezes.  Heart: Regular rate and rhythm, S1 and S2 normal, no murmurs, rubs auscultated Abdomen: BS normoactive,soft,nondistended,non-tender to palpation,no organomegaly Extremities: No pretibial edema, no erythema, no cyanosis, no clubbing Neuro : Alert, confused   Data Reviewed: Basic Metabolic Panel:  Recent Labs Lab 01/01/15 0748 01/02/15 0711 01/03/15 0637  NA 141 144 141  K 4.0 3.6 3.9  CL 112 115* 117*  CO2 20 23 20   GLUCOSE 168* 110* 109*  BUN 29* 20 17  CREATININE 1.07 0.84 0.80  CALCIUM 8.9 8.1* 7.9*   Liver Function Tests:  Recent Labs Lab 01/01/15 0748 01/02/15 0711  AST 93* 94*  ALT 31 40*  ALKPHOS 85 68  BILITOT 0.8 0.9  PROT 7.3 6.1  ALBUMIN 3.5 3.0*   No results for input(s): LIPASE, AMYLASE in the last 168 hours. No results for input(s): AMMONIA in the last 168 hours. CBC:  Recent Labs Lab 01/01/15 0748 01/02/15 0711 01/03/15 0637  WBC 14.3* 8.1 6.1  NEUTROABS 12.2*  --   --   HGB 12.4 10.5* 10.3*  HCT 38.2 32.9* 32.8*  MCV 97.9 98.2 100.6*  PLT 161 124* 121*   Cardiac Enzymes:  Recent Labs Lab 01/01/15 0037 01/01/15 0748 01/01/15 1143 01/01/15 1815  TROPONINI 0.41* 1.09* 1.11* 0.67*   BNP (last 3 results) No results for input(s): PROBNP in the last 8760 hours. CBG: No results for input(s): GLUCAP in the last 168 hours.  Recent Results (from the past 240 hour(s))  Urine  culture     Status: None (Preliminary result)   Collection Time: 01/01/15  8:01 AM  Result Value Ref Range Status   Specimen Description URINE, CATHETERIZED  Final   Special Requests Immunocompromised  Final   Colony Count   Final    >=100,000 COLONIES/ML Performed at [facility]    Culture   Final    GRAM NEGATIVE RODS Performed at [facility]    Report Status PENDING  Incomplete  MRSA PCR Screening     Status: Abnormal    Collection Time: 01/01/15  1:40 PM  Result Value Ref Range Status   MRSA by PCR POSITIVE (A) NEGATIVE Final    Comment:        The GeneXpert MRSA Assay (FDA approved for NASAL specimens only), is one component of a comprehensive MRSA colonization surveillance program. It is not intended to diagnose MRSA infection nor to guide or monitor treatment for MRSA infections. RESULT CALLED TO, READ BACK BY AND VERIFIED WITH: RUBY JOHNSON,RN [id number] @ 1728 BY J SCOTTON      Studies: No results found.  Scheduled Meds: . atorvastatin  20 mg Oral QHS  . Chlorhexidine Gluconate Cloth  6 each Topical Q0600  . docusate sodium  100 mg Oral BID  . feeding supplement (ENSURE COMPLETE)  237 mL Oral TID BM  . HYDROcodone-acetaminophen  1 tablet Oral BID  . latanoprost  1 drop Both Eyes QHS  . metoprolol tartrate  25 mg Oral BID  . mirtazapine  30 mg Oral QHS  . mupirocin ointment  1 application Nasal BID  . pantoprazole  40 mg Oral Daily  . piperacillin-tazobactam (ZOSYN)  IV  3.375 g Intravenous 3 times per day  . polyvinyl alcohol  1 drop Right Eye BID  . sertraline  25 mg Oral BH-q7a  . traZODone  50 mg Oral QHS  . warfarin  0.5 mg Oral ONCE-1800  . Warfarin - Pharmacist Dosing Inpatient   Does not apply q1800   Continuous Infusions: . sodium chloride 50 mL/hr at 01/03/15 0708    Principal Problem:   UTI (lower urinary tract infection) Active Problems:   Recurrent major depression-severe   Sepsis    Time spent: 20 min    [facility]  [facility] Pager [phone]. If 7PM-7AM, please contact night-coverage at [email or web address], password [facility] 01/03/2015, 3:25 PM  LOS: 2 days

## 2015-01-03 NOTE — Progress Notes (Signed)
CSW reviewed chart and noted [facility].   Per chart review and discussion with RN, pt agitated and confused, has sitter at bedside.  CSW to continue to follow pt progress and complete full psychosocial assessment when appropriate.  Loletta SpecterSuzanna Meghanne Pletz, MSW, LCSW Clinical Social Work [phone]

## 2015-01-03 NOTE — Progress Notes (Signed)
ANTICOAGULATION CONSULT NOTE - follow up  Pharmacy Consult for warfarin  Indication: history of PE  Allergies  Allergen Reactions  . Tuberculin Tests     [facility]     Patient Measurements: Height: 5\' 2"  (157.5 cm) Weight: 137 lb 12.6 oz (62.5 kg) IBW/kg (Calculated) : 50.1 Heparin Dosing Weight:  Vital Signs: Temp: 97.4 F (36.3 C) (02/01 0900) Temp Source: Oral (02/01 0900) BP: 112/68 mmHg (02/01 1020) Pulse Rate: 111 (02/01 1020)  Labs:  Recent Labs  01/01/15 0748 01/01/15 1143 01/01/15 1815 01/02/15 0711 01/03/15 0637  HGB 12.4  --   --  10.5* 10.3*  HCT 38.2  --   --  32.9* 32.8*  PLT 161  --   --  124* 121*  LABPROT  --  19.8*  --  22.4* 30.2*  INR  --  1.66*  --  1.95* 2.86*  CREATININE 1.07  --   --  0.84 0.80  TROPONINI 1.09* 1.11* 0.67*  --   --     Estimated Creatinine Clearance: 44.7 mL/min (by C-G formula based on Cr of 0.8).   Medical History: Past Medical History  Diagnosis Date  . Glaucoma   . Depression   . Pulmonary embolus   . Psychosis   . Hyperlipidemia   . Vitamin B12 deficiency   . Vitamin D deficiency   . 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 Dementia     Assessment: [age] [facility] at [facility].  She has h/o dementia and on warfarin for h/o PE.   CT head and maxillofacial negative for bleed. Pharmacy asked to dose warfarin  PTA warfarin dosing:  2.5mg  daily with 5mg  on Mondays   Today, 01/03/2015:   INR with dramatic jump overnight to high end of therapeutic range after being subtherapeutic yesterday  CBC: Hgb and pltc from admission, but appears to be stabilized  Drug - drug interactions:  No major interactions  Diet:  Regular; eating <50% of meals today  Goal of Therapy:  INR 2-3 Monitor platelets by anticoagulation protocol: Yes   Plan:   Warfarin 0.5 mg PO x 1 tonight.  Will not entirely hold dose as I am not totally certain this trajectory is real. Would resume  at home dose or slightly lower (2.0-2.5 every day) once INR stable, given acute illness and reduced PO intake   Daily INR  CBC at least q72 hr while on warfarin  Bernadene Personrew Tyresse Jayson, PharmD Pager: [phone] 01/03/2015, 11:09 AM

## 2015-01-04 ENCOUNTER — Encounter (HOSPITAL_COMMUNITY): Payer: Self-pay | Admitting: *Deleted

## 2015-01-04 DIAGNOSIS — F03918 Unspecified dementia, unspecified severity, with other behavioral disturbance: Secondary | ICD-10-CM

## 2015-01-04 DIAGNOSIS — Z86718 Personal history of other venous thrombosis and embolism: Secondary | ICD-10-CM

## 2015-01-04 DIAGNOSIS — F0391 Unspecified dementia with behavioral disturbance: Secondary | ICD-10-CM

## 2015-01-04 LAB — CLOSTRIDIUM DIFFICILE BY PCR: Toxigenic C. Difficile by PCR: NEGATIVE

## 2015-01-04 LAB — PROTIME-INR
INR: 3.09 — ABNORMAL HIGH (ref 0.00–1.49)
Prothrombin Time: 32.1 seconds — ABNORMAL HIGH (ref 11.6–15.2)

## 2015-01-04 LAB — URINE CULTURE: Colony Count: >=100000

## 2015-01-04 MED ORDER — LORAZEPAM 2 MG/ML IJ SOLN
0.5000 mg | Freq: Once | INTRAMUSCULAR | Status: AC
  Administered 2015-01-04: 0.5 mg via INTRAVENOUS
  Filled 2015-01-04: qty 1

## 2015-01-04 MED ORDER — CHLORHEXIDINE GLUCONATE CLOTH 2 % EX PADS
6.0000 | MEDICATED_PAD | Freq: Every morning | CUTANEOUS | Status: DC
  Administered 2015-01-05 – 2015-01-07 (×3): 6 via TOPICAL

## 2015-01-04 NOTE — Progress Notes (Signed)
ANTICOAGULATION CONSULT NOTE - follow up  Pharmacy Consult for warfarin  Indication: history of PE  Allergies  Allergen Reactions  . Tuberculin Tests     [facility]     Patient Measurements: Height: 5\' 2"  (157.5 cm) Weight: 137 lb 12.6 oz (62.5 kg) IBW/kg (Calculated) : 50.1 Heparin Dosing Weight:  Vital Signs: Temp: 98 F (36.7 C) (02/02 0800) Temp Source: Oral (02/02 0800) BP: 119/56 mmHg (02/02 0800) Pulse Rate: 64 (02/02 0800)  Labs:  Recent Labs  01/01/15 1143 01/01/15 1815 01/02/15 0711 01/03/15 0637 01/04/15 0403  HGB  --   --  10.5* 10.3*  --   HCT  --   --  32.9* 32.8*  --   PLT  --   --  124* 121*  --   LABPROT 19.8*  --  22.4* 30.2* 32.1*  INR 1.66*  --  1.95* 2.86* 3.09*  CREATININE  --   --  0.84 0.80  --   TROPONINI 1.11* 0.67*  --   --   --     Estimated Creatinine Clearance: 44.7 mL/min (by C-G formula based on Cr of 0.8).   Medical History: Past Medical History  Diagnosis Date  . Glaucoma   . Depression   . Pulmonary embolus   . Psychosis   . Hyperlipidemia   . Vitamin B12 deficiency   . Vitamin D deficiency   . 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 Dementia     Assessment: [age] [facility].  She has h/o dementia and on warfarin for h/o PE.   CT head and maxillofacial negative for bleed. Pharmacy asked to dose warfarin  PTA warfarin dosing:  2.5mg  daily with 5mg  on Mondays   Today, 01/04/2015:   INR slightly SUPRAtherapeutic today, trending up slightly today after large increase yesterday. Gave 0.5 mg yesterday to maintain some continuity in dosing  CBC (2/1): Hgb and pltc low from admission, but appears to be stabilized  Drug - drug interactions: on broad spectrum PCN  Diet:  Regular; eating <50% of meals today  Goal of Therapy:  INR 2-3 Monitor platelets by anticoagulation protocol: Yes   Plan:   Hold warfarin today; once INR <3.0 would resume at 1.5-2 mg daily  given poor PO intake and broad-spectrum Abx  Daily INR  CBC at least q72 hr while on warfarin  Bernadene Personrew Naija Troost, PharmD Pager: [phone] 01/04/2015, 10:22 AM

## 2015-01-04 NOTE — Progress Notes (Signed)
[facility] PROGRESS NOTE  Lang SnowJeanette Catala NWG:[id number] DOB: 1927/08/23 DOA: 01/01/2015 PCP: PROVIDER NOT IN SYSTEM  Assessment/Plan: 1. Sepsis- patient presented with hypotension, leukocytosis, tachypnea, tachycardia with abnormal UA. Started on IV Zosyn for UTI, she has improved. Urine culture is growing Escherichia coli. Continue IV Zosyn. 2. Lethargy/hypersomnolence- patient was started on Ativan 2 mg IV every 4 hours when necessary for worsening agitation. Now she has become very lethargic, will discontinue IV Ativan and continue with when necessary Valium 5 mg every 8 hours. 3. UTI- patient was found to have abnormal UA, started on IV Zosyn as above. Urine culture is growing Escherichia coli sensitive to Zosyn. Patient can be discharged on Keflex when ready for discharge. 4. Elevated troponin- patient denies any chest pain, likely due to demand ischemia from tachycardia and hypotension. Troponin is now coming down 0.67. EKG shows no acute changes except sinus tachycardia with PVCs. 5. History of DVT- continue Coumadin per pharmacy consultation 6. Severe anxiety/dementia- patient is more lethargic today, will discontinue IV Ativan. 7. History of hypertension- metoprolol was held on the day of admission due to hypotension, blood pressure has not improved so we restarted the metoprolol as patient  cardiac monitor showed sinus tachycardia with multiple PVCs on the EKG.   Code Status: DNR Family Communication: *Called and discussed with son on phone on 01/03/2015. Disposition Plan: Pending PT evaluation, if patient's mental status comes back to baseline, possible discharge in next 24-48 hours   Consultants:  None  Procedures:  None  Antibiotics:  Zosyn 01/01/2015  HPI/Subjective: 81 yr old female from skilled facility with a history of dementia, pulmonary embolism, hyperlipidemia who was brought to the hospital after she was found on the floor. In the ED patient was found  to have abnormal UA, hypertension, mild elevation of troponin 1.09 so patient was admitted for treatment of UTI. Patient got Ativan 2 mg last night, and has been more lethargic than usual.  Objective: Filed Vitals:   01/04/15 0800  BP: 119/56  Pulse: 64  Temp: 98 F (36.7 C)  Resp: 25    Intake/Output Summary (Last 24 hours) at 01/04/15 1141 Last data filed at 01/04/15 1000  Gross per 24 hour  Intake   1425 ml  Output   1051 ml  Net    374 ml   Filed Weights   01/01/15 1400  Weight: 62.5 kg (137 lb 12.6 oz)    Exam:  Physical Exam: Eyes: No icterus, extraocular muscles intact  Lungs: Normal respiratory effort, bilateral clear to auscultation, no crackles or wheezes.  Heart: Regular rate and rhythm, S1 and S2 normal, no murmurs, rubs auscultated Abdomen: BS normoactive,soft,nondistended,non-tender to palpation,no organomegaly Extremities: No pretibial edema, no erythema, no cyanosis, no clubbing Neuro : Drowsy but arousable , confused   Data Reviewed: Basic Metabolic Panel:  Recent Labs Lab 01/01/15 0748 01/02/15 0711 01/03/15 0637  NA 141 144 141  K 4.0 3.6 3.9  CL 112 115* 117*  CO2 20 23 20   GLUCOSE 168* 110* 109*  BUN 29* 20 17  CREATININE 1.07 0.84 0.80  CALCIUM 8.9 8.1* 7.9*   Liver Function Tests:  Recent Labs Lab 01/01/15 0748 01/02/15 0711  AST 93* 94*  ALT 31 40*  ALKPHOS 85 68  BILITOT 0.8 0.9  PROT 7.3 6.1  ALBUMIN 3.5 3.0*   No results for input(s): LIPASE, AMYLASE in the last 168 hours. No results for input(s): AMMONIA in the last 168 hours. CBC:  Recent Labs Lab 01/01/15 [phone]  01/02/15 0711 01/03/15 0637  WBC 14.3* 8.1 6.1  NEUTROABS 12.2*  --   --   HGB 12.4 10.5* 10.3*  HCT 38.2 32.9* 32.8*  MCV 97.9 98.2 100.6*  PLT 161 124* 121*   Cardiac Enzymes:  Recent Labs Lab 01/01/15 0037 01/01/15 0748 01/01/15 1143 01/01/15 1815  TROPONINI 0.41* 1.09* 1.11* 0.67*   BNP (last 3 results) No results for input(s): PROBNP  in the last 8760 hours. CBG: No results for input(s): GLUCAP in the last 168 hours.  Recent Results (from the past 240 hour(s))  Urine culture     Status: None   Collection Time: 01/01/15  8:01 AM  Result Value Ref Range Status   Specimen Description URINE, CATHETERIZED  Final   Special Requests Immunocompromised  Final   Colony Count   Final    >=100,000 COLONIES/ML Performed at [facility]    Culture   Final    ESCHERICHIA COLI Performed at [facility]    Report Status 01/04/2015 FINAL  Final   Organism ID, Bacteria ESCHERICHIA COLI  Final      Susceptibility   Escherichia coli - MIC*    AMPICILLIN >=32 RESISTANT Resistant     CEFAZOLIN <=4 SENSITIVE Sensitive     CEFTRIAXONE <=1 SENSITIVE Sensitive     CIPROFLOXACIN >=4 RESISTANT Resistant     GENTAMICIN >=16 RESISTANT Resistant     LEVOFLOXACIN >=8 RESISTANT Resistant     NITROFURANTOIN <=16 SENSITIVE Sensitive     TOBRAMYCIN 8 INTERMEDIATE Intermediate     TRIMETH/SULFA <=20 SENSITIVE Sensitive     PIP/TAZO <=4 SENSITIVE Sensitive     * ESCHERICHIA COLI  MRSA PCR Screening     Status: Abnormal   Collection Time: 01/01/15  1:40 PM  Result Value Ref Range Status   MRSA by PCR POSITIVE (A) NEGATIVE Final    Comment:        The GeneXpert MRSA Assay (FDA approved for NASAL specimens only), is one component of a comprehensive MRSA colonization surveillance program. It is not intended to diagnose MRSA infection nor to guide or monitor treatment for MRSA infections. RESULT CALLED TO, READ BACK BY AND VERIFIED WITH: RUBY JOHNSON,RN [id number] @ 1728 BY J SCOTTON      Studies: No results found.  Scheduled Meds: . atorvastatin  20 mg Oral QHS  . Chlorhexidine Gluconate Cloth  6 each Topical q morning - 10a  . docusate sodium  100 mg Oral BID  . feeding supplement (ENSURE COMPLETE)  237 mL Oral TID BM  . HYDROcodone-acetaminophen  1 tablet Oral BID  . latanoprost  1 drop Both Eyes QHS  . metoprolol  tartrate  25 mg Oral BID  . mirtazapine  30 mg Oral QHS  . mupirocin ointment  1 application Nasal BID  . pantoprazole  40 mg Oral Daily  . piperacillin-tazobactam (ZOSYN)  IV  3.375 g Intravenous 3 times per day  . polyvinyl alcohol  1 drop Right Eye BID  . sertraline  25 mg Oral BH-q7a  . traZODone  50 mg Oral QHS  . Warfarin - Pharmacist Dosing Inpatient   Does not apply q1800   Continuous Infusions: . sodium chloride 50 mL/hr at 01/03/15 2019    Principal Problem:   UTI (lower urinary tract infection) Active Problems:   Recurrent major depression-severe   Sepsis    Time spent: 20 min    [facility]  [facility] Pager [phone]. If 7PM-7AM, please contact night-coverage at [email or web address], password [facility]  01/04/2015, 11:41 AM  LOS: 3 days

## 2015-01-04 NOTE — Progress Notes (Signed)
Pt very agitated and restless. Pt screaming and continuing to attempt to get up out of bed. Pt has son at bedside and sitter to help reduce agitation w/o any relief. Pt is also unable to take medication by mouth. Attempted to give pt meds po and pt just held meds in mouth. Called midlevel will continue to monitor.

## 2015-01-04 NOTE — Evaluation (Signed)
Physical Therapy Evaluation Patient Details Name: [name] MRN: [id number] DOB: 12/21/1926 Today's Date: 01/04/2015   History of Present Illness  81 yo female admitted 01/02/15 after fall, [facility]. patient presented with hypotension, leukocytosis, tachypnea, tachycardia with abnormal UA  Clinical Impression  Patient did participate in  Bed mobility and standing at bedside with moderate assistance. Patient will benefit from PT to address problems listed in note below. Patient may require higher level of care with recent falls and decreased  Ability to ambulate currently.    Follow Up Recommendations SNF;Supervision/Assistance - 24 hour (unless ALF can provde  care  at current level of mod assist, for transfers only.)    Equipment Recommendations  None recommended by PT    Recommendations for Other Services       Precautions / Restrictions Precautions Precautions: Fall Precaution Comments: multiple abrasions on legs , elbows, incontinence      Mobility  Bed Mobility Overal bed mobility: Needs Assistance Bed Mobility: Supine to Sit;Sit to Supine     Supine to sit: Mod assist Sit to supine: Mod assist   General bed mobility comments: extra time for activity  of sitting up, assist with legs onto bed.  Transfers Overall transfer level: Needs assistance   Transfers: Sit to/from Stand Sit to Stand: Mod assist         General transfer comment: stood x 3 from bed , attempted to take side steps but  could not get pt to understand command,   Ambulation/Gait- NT, unable to stand long enough to attempt.                Stairs            Wheelchair Mobility    Modified Rankin (Stroke Patients Only)       Balance Overall balance assessment: Needs assistance Sitting-balance support: Feet supported;No upper extremity supported Sitting balance-Leahy Scale: Fair                                       Pertinent  Vitals/Pain Pain Assessment: Faces Faces Pain Scale: Hurts little more Pain Location: knees Pain Intervention(s): Limited activity within patient's tolerance;Monitored during session    Home Living Family/patient expects to be discharged to:: Unsure                 Additional Comments: from ALF, currently is  transfers only w/ 1 persom moderate assist, not amulatory at this time    Prior Function                 Hand Dominance        Extremity/Trunk Assessment   Upper Extremity Assessment: Generalized weakness           Lower Extremity Assessment: Generalized weakness;RLE deficits/detail;LLE deficits/detail RLE Deficits / Details: bears weight in standing onlyy briefly LLE Deficits / Details: same as R     Communication   Communication:  (speaks [place]French, does follow [place])  Cognition Arousal/Alertness: Awake/alert Behavior During Therapy: WFL for tasks assessed/performed Overall Cognitive Status: History of cognitive impairments - at baseline Area of Impairment: Orientation;Following commands       Following Commands: Follows one step commands inconsistently            General Comments      Exercises        Assessment/Plan    PT Assessment Patient needs continued PT  services  PT Diagnosis Difficulty walking;Generalized weakness;Altered mental status   PT Problem List Decreased strength;Decreased activity tolerance;Decreased balance;Decreased mobility;Decreased knowledge of precautions;Decreased safety awareness;Decreased knowledge of use of DME;Pain;Decreased cognition  PT Treatment Interventions DME instruction;Gait training;Functional mobility training;Therapeutic activities;Therapeutic exercise;Patient/family education   PT Goals (Current goals can be found in the Care Plan section) Acute Rehab PT Goals PT Goal Formulation: Patient unable to participate in goal setting Time For Goal Achievement: 01/18/15 Potential to Achieve Goals:  Fair    Frequency Min 2X/week   Barriers to discharge        Co-evaluation               End of Session   Activity Tolerance: Patient tolerated treatment well Patient left: in bed;with call bell/phone within reach;with nursing/sitter in room Nurse Communication: Mobility status         Time: 1610-9604 PT Time Calculation (min) (ACUTE ONLY): 10 min   Charges:   PT Evaluation $Initial PT Evaluation Tier I: 1 Procedure     PT G CodesRada Hay 01/04/2015, 3:03 PM Blanchard Kelch PT [phone]

## 2015-01-04 NOTE — Progress Notes (Signed)
Clinical Social Work Department BRIEF PSYCHOSOCIAL ASSESSMENT 01/04/2015  Patient:  [name],[name]     Account Number:  [id number]     Admit date:  01/01/2015  Clinical Social Worker:  Hampton AbbotGRANT,Linnie Mcglocklin, CLINICAL SOCIAL WORKER  Date/Time:  01/04/2015 12:00 M  Referred by:  Physician  Date Referred:  01/04/2015 Referred for  SNF Placement   Other Referral:   Interview type:  Family Other interview type:   Son- Eddie Hazelrigg    PSYCHOSOCIAL DATA Living Status:  FACILITY Admitted from facility:   Level of care:  Assisted Living Primary support name:  Eddie Wach Primary support relationship to patient:  CHILD, ADULT Degree of support available:   Adequate    CURRENT CONCERNS Current Concerns  Post-Acute Placement   Other Concerns:    SOCIAL WORK ASSESSMENT / PLAN CSW to complete FL2 and [facility].   Assessment/plan status:  Psychosocial Support/Ongoing Assessment of Needs Other assessment/ plan:   Information/referral to community resources:    PATIENT'S/FAMILY'S RESPONSE TO PLAN OF CARE: CSW reviewed charts, went to speak with patient at bedside. Patient is agitated at this time. CSW contacted patient son Link Snufferddie to verify living situation before being admitted to the hospital and to discuss DC plans. Son reports that as of now, [facility] if things goes well at the hospital. Son inquires about a SNF for long term care since the ALF cannot really deal with patient medical needs like the fall which is why the patient is here at the hospital. CSW told son that once PT works with patient, we could discuss further plan for a SNF but depends on the patient's need for SNF. CSW also spoke with [facility]'s baseline. Artist PaisShawna was not able to give details at the time but reports that she will give CSW a call back at her earliest convenience. CSW will continue to  follow.       Hampton AbbotKadijah Weston Kallman BSW Intern

## 2015-01-05 DIAGNOSIS — F0391 Unspecified dementia with behavioral disturbance: Secondary | ICD-10-CM

## 2015-01-05 DIAGNOSIS — Z86718 Personal history of other venous thrombosis and embolism: Secondary | ICD-10-CM

## 2015-01-05 DIAGNOSIS — A4151 Sepsis due to Escherichia coli [E. coli]: Secondary | ICD-10-CM

## 2015-01-05 LAB — PROTIME-INR
INR: 2.44 — ABNORMAL HIGH (ref 0.00–1.49)
PROTHROMBIN TIME: 26.7 s — AB (ref 11.6–15.2)

## 2015-01-05 MED ORDER — LORAZEPAM 2 MG/ML IJ SOLN
0.5000 mg | INTRAMUSCULAR | Status: DC | PRN
  Administered 2015-01-05: 0.5 mg via INTRAVENOUS
  Filled 2015-01-05: qty 1

## 2015-01-05 MED ORDER — IPRATROPIUM-ALBUTEROL 0.5-2.5 (3) MG/3ML IN SOLN
3.0000 mL | RESPIRATORY_TRACT | Status: DC
  Administered 2015-01-05: 3 mL via RESPIRATORY_TRACT
  Filled 2015-01-05: qty 3

## 2015-01-05 MED ORDER — LORAZEPAM 2 MG/ML IJ SOLN
1.0000 mg | INTRAMUSCULAR | Status: AC | PRN
  Administered 2015-01-05 – 2015-01-06 (×5): 1 mg via INTRAVENOUS
  Filled 2015-01-05 (×5): qty 1

## 2015-01-05 MED ORDER — LORAZEPAM 2 MG/ML IJ SOLN: 0.5000 mg | Freq: Four times a day (QID) | INTRAMUSCULAR | Status: DC | PRN

## 2015-01-05 MED ORDER — IPRATROPIUM-ALBUTEROL 0.5-2.5 (3) MG/3ML IN SOLN: 3.0000 mL | RESPIRATORY_TRACT | Status: DC | PRN

## 2015-01-05 MED ORDER — WARFARIN SODIUM 1 MG PO TABS
1.0000 mg | ORAL_TABLET | Freq: Once | ORAL | Status: AC
  Administered 2015-01-05: 1 mg via ORAL
  Filled 2015-01-05: qty 1

## 2015-01-05 MED ORDER — HALOPERIDOL LACTATE 5 MG/ML IJ SOLN
5.0000 mg | Freq: Four times a day (QID) | INTRAMUSCULAR | Status: DC | PRN
  Administered 2015-01-05 – 2015-01-06 (×4): 5 mg via INTRAVENOUS
  Filled 2015-01-05 (×4): qty 1

## 2015-01-05 MED ORDER — HALOPERIDOL LACTATE 5 MG/ML IJ SOLN
2.0000 mg | Freq: Four times a day (QID) | INTRAMUSCULAR | Status: DC | PRN
  Administered 2015-01-05: 2 mg via INTRAVENOUS

## 2015-01-05 MED ORDER — CEFAZOLIN SODIUM 1-5 GM-% IV SOLN
1.0000 g | Freq: Three times a day (TID) | INTRAVENOUS | Status: DC
  Administered 2015-01-05 – 2015-01-07 (×7): 1 g via INTRAVENOUS
  Filled 2015-01-05 (×8): qty 50

## 2015-01-05 MED ORDER — HALOPERIDOL LACTATE 5 MG/ML IJ SOLN
INTRAMUSCULAR | Status: AC
  Filled 2015-01-05: qty 1

## 2015-01-05 MED ORDER — LORAZEPAM 2 MG/ML IJ SOLN
0.2500 mg | Freq: Once | INTRAMUSCULAR | Status: AC
  Administered 2015-01-05: 0.25 mg via INTRAVENOUS
  Filled 2015-01-05: qty 1

## 2015-01-05 MED ORDER — METOPROLOL TARTRATE 1 MG/ML IV SOLN
2.5000 mg | Freq: Three times a day (TID) | INTRAVENOUS | Status: DC
  Administered 2015-01-05 – 2015-01-07 (×8): 2.5 mg via INTRAVENOUS
  Filled 2015-01-05 (×10): qty 5

## 2015-01-05 NOTE — Progress Notes (Signed)
ANTICOAGULATION CONSULT NOTE - follow up  Pharmacy Consult for warfarin  Indication: history of DVT  Allergies  Allergen Reactions  . Tuberculin Tests     [facility]     Patient Measurements: Height: 5\' 2"  (157.5 cm) Weight: 137 lb 12.6 oz (62.5 kg) IBW/kg (Calculated) : 50.1  Vital Signs:    Labs:  Recent Labs  01/02/15 0711 01/03/15 0637 01/04/15 0403 01/05/15 0555  HGB 10.5* 10.3*  --   --   HCT 32.9* 32.8*  --   --   PLT 124* 121*  --   --   LABPROT 22.4* 30.2* 32.1* 26.7*  INR 1.95* 2.86* 3.09* 2.44*  CREATININE 0.84 0.80  --   --     Estimated Creatinine Clearance: 44.7 mL/min (by C-G formula based on Cr of 0.8).   Medical History: Past Medical History  Diagnosis Date  . Glaucoma   . Depression   . Pulmonary embolus   . Psychosis   . Hyperlipidemia   . Vitamin B12 deficiency   . Vitamin D deficiency   . 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 Dementia     Assessment: 89 [facility] being found [facility] on floor at SNF.  She has h/o dementia and on warfarin for h/o DVT.   CT head and maxillofacial negative for bleed. Pharmacy asked to dose warfarin  PTA warfarin dosing:  2.5mg  daily with 5mg  on Mondays   Today, 01/05/2015:   INR back to therapeutic range after holding warfarin yesterday.  CBC (2/1): Hgb and pltc low from admission, but appeared to be stabilized  Drug - drug interactions: on broad-spectrum PCN (can alter bowel flora and reduce warfarin dosage requirements).  Diet:  Regular.  Charted as eating only small amounts. Reportedly has been agitated and confused.  No bleeding reported in chart note.  Goal of Therapy:  INR 2-3    Plan:   Warfarin 1 mg PO today at [facility]6PM  Daily INR while inpatient  Elie Goodyandy Jandy Brackens, PharmD, BCPS Pager: [phone] 01/05/2015  6:58 AM

## 2015-01-05 NOTE — Progress Notes (Addendum)
Patient ID: [name], female   DOB: 10-May-1927, 81 y.o.   MRN: [id number] [facility] PROGRESS NOTE  [name] ZMO:[id number] DOB: [birth year] DOA: 01/01/2015 PCP: PROVIDER NOT IN SYSTEM  Brief narrative:    81 yr old female from skilled facility with a history of dementia, pulmonary embolism, hyperlipidemia who presented to [facility] ED status post lethargy, found on floor in nursing home. On admission, she was found to have UTI and elevated troponin level.   Assessment/Plan:    Principal Problem: Sepsis secondary to UTI E.Coli / leukocytosis  - sepsis criteria met on admission with hypotension, leukocytosis, tachypnea, tachycardia and source of infection, E.Coli UTI - started on IV zosyn - urine culture growing E.Coli - switched to Ancef this am, 01/05/2015 based on sensitivity report. - C.diff negative.  Active Problems: Acute metabolic encephalopathy / dementia - apparently, pt at baseline mental status, agitate - will use ativan and haldol, alternating to control agitation, confusion - palliative consulted for goals of care  Elevated troponin - likely demand ischemia from sepsis due to UTI - no complaints of chest pain - troponin level trended down to 0.67 - 12 lead EKG with no acute ischemic changes   History of DVT / PE -  On AC with coumadin  - INR 2.44  Essential hypertension - metoprolol was held on the day of admission due to hypotension - BP improved - changed metoprolol to IV regimen  Dyslipidemia - continue statin therapy   Anxiety and depression - continue Zoloft, Remeron - ativan and haldol alternating PRN   Thrombocytopenia - likely due to history of anticoagulation - platelet count 121 - monitor CBC  DVT Prophylaxis  - on AC with coumadin   Code Status: DNR/DNI Family Communication:  No family at the bedside Disposition Plan: not yet ready for discharge; very agitated, needs palliative care for goals of care.   IV access:   Peripheral IV  Procedures and diagnostic studies:    No results found.  Medical Consultants:  Palliative care   Other Consultants:  Physical therapy  SLP  IAnti-Infectives:   Zosyn 01/01/2015 --> 01/04/2015 Ancef 01/04/2015 -->   Leisa Lenz, MD  [facility] Pager [phone]  If 7PM-7AM, please contact night-coverage [email or web address] Password [email or web address] 01/05/2015, 2:56 PM   LOS: 4 days    HPI/Subjective: No acute overnight events.  Objective: Filed Vitals:   01/04/15 0800 01/04/15 1300 01/05/15 0615 01/05/15 1223  BP: 119/56 123/74 117/86 123/90  Pulse: 64  68 62  Temp: 98 F (36.7 C) 97.9 F (36.6 C) 98 F (36.7 C) 98.7 F (37.1 C)  TempSrc: Oral  Oral Oral  Resp: 25 22 20 20   Height:      Weight:      SpO2: 92% 95% 98% 91%    Intake/Output Summary (Last 24 hours) at 01/05/15 1456 Last data filed at 01/05/15 1245  Gross per 24 hour  Intake   1150 ml  Output    900 ml  Net    250 ml    Exam:   General:  Pt is alert, disoriented, agitated   Cardiovascular: Regular rate and rhythm, S1/S2 (+)  Respiratory: mild wheezing in upper lung lobes, no rhonchi  Abdomen: Soft, non tender, non distended, bowel sounds present  Extremities: No edema, pulses DP and PT palpable bilaterally; ecchymoses in UE  Neuro: Grossly nonfocal  Data Reviewed: Basic Metabolic Panel:  Recent Labs Lab 01/01/15 0748 01/02/15 0711 01/03/15 0637  NA 141 144 141  K 4.0  3.6 3.9  CL 112 115* 117*  CO2 20 23 20   GLUCOSE 168* 110* 109*  BUN 29* 20 17  CREATININE 1.07 0.84 0.80  CALCIUM 8.9 8.1* 7.9*   Liver Function Tests:  Recent Labs Lab 01/01/15 0748 01/02/15 0711  AST 93* 94*  ALT 31 40*  ALKPHOS 85 68  BILITOT 0.8 0.9  PROT 7.3 6.1  ALBUMIN 3.5 3.0*   No results for input(s): LIPASE, AMYLASE in the last 168 hours. No results for input(s): AMMONIA in the last 168 hours. CBC:  Recent Labs Lab 01/01/15 0748 01/02/15 0711 01/03/15 0637  WBC 14.3*  8.1 6.1  NEUTROABS 12.2*  --   --   HGB 12.4 10.5* 10.3*  HCT 38.2 32.9* 32.8*  MCV 97.9 98.2 100.6*  PLT 161 124* 121*   Cardiac Enzymes:  Recent Labs Lab 01/01/15 0037 01/01/15 0748 01/01/15 1143 01/01/15 1815  TROPONINI 0.41* 1.09* 1.11* 0.67*   BNP: Invalid input(s): POCBNP CBG: No results for input(s): GLUCAP in the last 168 hours.  Urine culture     Status: None   Collection Time: 01/01/15  8:01 AM  Result Value Ref Range Status   Specimen Description URINE, CATHETERIZED  Final   Colony Count   Final   Culture   Final    ESCHERICHIA COLI Performed at [facility]    Report Status 01/04/2015 FINAL  Final   Organism ID, Bacteria ESCHERICHIA COLI  Final      Susceptibility   Escherichia coli - MIC*    AMPICILLIN >=32 RESISTANT Resistant     CEFAZOLIN <=4 SENSITIVE Sensitive     CEFTRIAXONE <=1 SENSITIVE Sensitive     CIPROFLOXACIN >=4 RESISTANT Resistant     GENTAMICIN >=16 RESISTANT Resistant     LEVOFLOXACIN >=8 RESISTANT Resistant     NITROFURANTOIN <=16 SENSITIVE Sensitive     TOBRAMYCIN 8 INTERMEDIATE Intermediate     TRIMETH/SULFA <=20 SENSITIVE Sensitive     PIP/TAZO <=4 SENSITIVE Sensitive     * ESCHERICHIA COLI  MRSA PCR Screening     Status: Abnormal   Collection Time: 01/01/15  1:40 PM  Result Value Ref Range Status   MRSA by PCR POSITIVE (A) NEGATIVE Final  Clostridium Difficile by PCR     Status: None   Collection Time: 01/04/15 12:57 PM  Result Value Ref Range Status   C difficile by pcr NEGATIVE NEGATIVE Final    Comment: Performed at [facility]     Scheduled Meds: . atorvastatin  20 mg Oral QHS  .  ceFAZolin (ANCEF) IV  1 g Intravenous 3 times per day  . docusate sodium  100 mg Oral BID  . feeding supplement (ENSURE COMPLETE)  237 mL Oral TID BM  . HYDROcodone-acetaminophen  1 tablet Oral BID  . latanoprost  1 drop Both Eyes QHS  . metoprolol  2.5 mg Intravenous 3 times per day  . mirtazapine  30 mg Oral QHS  .  mupirocin ointment  1 application Nasal BID  . pantoprazole  40 mg Oral Daily  . sertraline  25 mg Oral BH-q7a  . warfarin  1 mg Oral ONCE-1800   Continuous Infusions: . sodium chloride 50 mL/hr (01/04/15 1749)

## 2015-01-05 NOTE — Progress Notes (Signed)
Clinical Social Work  CSW spoke with Shauna at ALF who reports that patient has been at their facility since October. ALF reports that patient requires assistance with bathing and dressing and transfers. ALF reports that they had ordered PT for patient but patient was not compliant with treatment so PT was DC. ALF reports patient has dementia and psych problems. Patient often makes statements of wanting to die but son has reported that these are chronic statements and patient has been making those statements for the past 8 years. Psych follows patient at ALF and adjusts medications as needed.  CSW spoke with son Link Snuffer(Eddie) via phone. Son reports he feels that patient is deconditioned and requesting information on SNF. CSW emailed SNF list and explained process. Son reports that patient has not been to SNF in the past and will review list. CSW explained Medicare coverage for SNF and that patient would have to have skilled need for Medicare to continue to provide coverage.   [facility] [place] [facility]. CSW completed FL2 and faxed out. CSW submitted clinicals for PASRR but additional clinicals are being requested for 30 day note. CSW made MD aware who will sign 30 day note on 01/06/15.   CSW spoke with bedside RN re: patient needing to be sitter free for 24 hours prior to DC. CSW will continue to follow.  River EdgeHolly Marieke Lubke, Kentucky[place]CSW [phone]

## 2015-01-05 NOTE — Progress Notes (Addendum)
Clinical Social Work Department CLINICAL SOCIAL WORK PLACEMENT NOTE 01/05/2015  Patient:  [name],[name]  Account Number:  [id number] Admit date:  01/01/2015  Clinical Social Worker:  Unk LightningHOLLY Dacen Frayre, LCSW  Date/time:  01/05/2015 04:00 PM  Clinical Social Work is seeking post-discharge placement for this patient at the following level of care:   SKILLED NURSING   (*CSW will update this form in Epic as items are completed)   01/05/2015  Patient/family provided with [facility] Department of Clinical Social Work's list of facilities offering this level of care within the geographic area requested by the patient (or if unable, by the patient's family).  01/05/2015  Patient/family informed of their freedom to choose among providers that offer the needed level of care, that participate in Medicare, Medicaid or managed care program needed by the patient, have an available bed and are willing to accept the patient.  01/05/2015  Patient/family informed of [facility]' ownership interest in [facility], as well as of the fact that they are under no obligation to receive care at this facility.  PASARR submitted to EDS on 01/05/2015 PASARR number received on 01/07/2015  FL2 transmitted to all facilities in geographic area requested by pt/family on  01/05/2015 FL2 transmitted to all facilities within larger geographic area on   Patient informed that his/her managed care company has contracts with or will negotiate with  certain facilities, including the following:     Patient/family informed of bed offers received:  01/06/2015 [facility] Physician recommends and patient chooses bed at    [facility]  on  01/07/2015 Patient to be transferred to facility by [facility] Patient and family notified of transfer on 01/07/2015 Name of family member notified:  Son-Eddie via phone  The following physician request were entered in  Epic:   Additional Comments: 01/06/15-Additional clinicals faxed to [facility] for PASRR review.

## 2015-01-05 NOTE — Evaluation (Addendum)
Clinical/Bedside Swallow Evaluation Patient Details  Name: [name] MRN: [id number] Date of Birth: 1927/05/08  Today's Date: 01/05/2015 Time: SLP Start Time (ACUTE ONLY): 1023 SLP Stop Time (ACUTE ONLY): 1045 SLP Time Calculation (min) (ACUTE ONLY): 22 min  Past Medical History:  Past Medical History  Diagnosis Date  . Glaucoma   . Depression   . Pulmonary embolus   . Psychosis   . Hyperlipidemia   . Vitamin B12 deficiency   . Vitamin D deficiency   . 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 Dementia    Past Surgical History: History reviewed. No pertinent past surgical history. HPI:  81 [facility].  PMH + for memory loss, tachycardia, resides at ALF.  Pt had UGI study in 08 showing reflux and HH.  Swallow evaluation ordered as pt was pocketing pills.     Assessment / Plan / Recommendation Clinical Impression  Pt presents with mild cognitive based dysphagia that is exacerbated by her agitation.  No s/s of aspiraton noted with po and CN exam unremarkable.  Pt did require encouragement to eat and needed assistance.  She needed cues to cease talking with food in her mouth and therefore will need full supervision with meals.  Recommend dys3/thin diet with assist for maximal airway protection. SLP to sign off.  Thanks     Aspiration Risk  Mild    Diet Recommendation Dysphagia 3 (Mechanical Soft);Thin liquid   Liquid Administration via: Cup;Straw Medication Administration: Whole meds with liquid or with puree if problematic Supervision: Full supervision/cueing for compensatory strategies Compensations: Slow rate;Small sips/bites;Check for pocketing Postural Changes and/or Swallow Maneuvers: Seated upright 90 degrees;Upright 30-60 min after meal    Other  Recommendations Oral Care Recommendations: Oral care BID   Follow Up Recommendations    n/a   Frequency and Duration   n/a     Pertinent Vitals/Pain Afebrile, decreased     Swallow  Study    General Date of Onset: 01/05/15 HPI: 81 [facility].  PMH + for memory loss, tachycardia, resides at ALF.  Pt had UGI study in 08 showing reflux and HH.  Swallow evaluation ordered as pt was pocketing pills.   Type of Study: Bedside swallow evaluation Diet Prior to this Study: NPO Temperature Spikes Noted: No Respiratory Status: Room air History of Recent Intubation: No Behavior/Cognition: Alert;Impulsive;Uncooperative Oral Cavity - Dentition: Adequate natural dentition Self-Feeding Abilities: Able to feed self Patient Positioning: Upright in bed Baseline Vocal Quality: Clear Volitional Cough: Strong Volitional Swallow: Unable to elicit    Oral/Motor/Sensory Function Overall Oral Motor/Sensory Function: Appears within functional limits for tasks assessed (generalized weakness)   Ice Chips Ice chips: Not tested   Thin Liquid Thin Liquid: Within functional limits Presentation: Straw;Self Fed;Cup    Nectar Thick Nectar Thick Liquid: Not tested   Honey Thick Honey Thick Liquid: Not tested   Puree Puree: Within functional limits Presentation: Spoon   Solid   GO    Solid: Impaired Presentation: Self Fed Oral Phase Impairments: Impaired mastication;Impaired anterior to posterior transit;Reduced lingual movement/coordination Oral Phase Functional Implications: Oral residue Pharyngeal Phase Impairments: Suspected delayed Fredric MareSwallow       Ansh Fauble, MS [facility] SLP [phone]

## 2015-01-05 NOTE — Progress Notes (Signed)
Foley catheter inserted with 500 cc's yellow cloudy foul smelling urine.  Patient bladder scanned after catheter inserted and it showed 0 cc's.  Philomena Dohenyavid Skylin Kennerson RN

## 2015-01-05 NOTE — Progress Notes (Signed)
Order received for swallow eval, please note pt was seen earlier today at 1116.  Cognitive based dysphagia noted for which may be compensated.  Please refer to prior BSE and reorder if indicated. Donavan Burnetamara Lynetta Tomczak, MS [facility] SLP [phone]

## 2015-01-06 DIAGNOSIS — R451 Restlessness and agitation: Secondary | ICD-10-CM

## 2015-01-06 DIAGNOSIS — Z515 Encounter for palliative care: Secondary | ICD-10-CM

## 2015-01-06 LAB — PROTIME-INR
INR: 2.75 — ABNORMAL HIGH (ref 0.00–1.49)
PROTHROMBIN TIME: 29.3 s — AB (ref 11.6–15.2)

## 2015-01-06 MED ORDER — LORAZEPAM 1 MG PO TABS
1.0000 mg | ORAL_TABLET | Freq: Two times a day (BID) | ORAL | Status: DC
  Administered 2015-01-06: 1 mg via ORAL
  Filled 2015-01-06: qty 1

## 2015-01-06 MED ORDER — SERTRALINE HCL 25 MG PO TABS
25.0000 mg | ORAL_TABLET | Freq: Every day | ORAL | Status: DC
  Administered 2015-01-07: 25 mg via ORAL
  Filled 2015-01-06: qty 1

## 2015-01-06 MED ORDER — HALOPERIDOL LACTATE 5 MG/ML IJ SOLN: 2.0000 mg | Freq: Once | INTRAMUSCULAR | Status: DC

## 2015-01-06 MED ORDER — IPRATROPIUM-ALBUTEROL 0.5-2.5 (3) MG/3ML IN SOLN
3.0000 mL | RESPIRATORY_TRACT | Status: DC
  Administered 2015-01-06 (×2): 3 mL via RESPIRATORY_TRACT
  Filled 2015-01-06 (×3): qty 3

## 2015-01-06 MED ORDER — IPRATROPIUM-ALBUTEROL 0.5-2.5 (3) MG/3ML IN SOLN
3.0000 mL | Freq: Two times a day (BID) | RESPIRATORY_TRACT | Status: DC
  Administered 2015-01-07: 3 mL via RESPIRATORY_TRACT
  Filled 2015-01-06: qty 3

## 2015-01-06 MED ORDER — LORAZEPAM 2 MG/ML IJ SOLN: 1.0000 mg | Freq: Four times a day (QID) | INTRAMUSCULAR | Status: DC | PRN

## 2015-01-06 MED ORDER — WARFARIN 0.5 MG HALF TABLET
0.5000 mg | ORAL_TABLET | Freq: Once | ORAL | Status: DC
  Filled 2015-01-06: qty 1

## 2015-01-06 NOTE — Progress Notes (Signed)
ANTICOAGULATION CONSULT NOTE - follow up  Pharmacy Consult for warfarin  Indication: history of DVT  Allergies  Allergen Reactions  . Tuberculin Tests     [facility]     Patient Measurements: Height: 5\' 2"  (157.5 cm) Weight: 137 lb 12.6 oz (62.5 kg) IBW/kg (Calculated) : 50.1  Vital Signs: Temp: 98.5 F (36.9 C) (02/04 0417) Temp Source: Oral (02/04 0417) BP: 162/91 mmHg (02/04 0417) Pulse Rate: 77 (02/04 0417)  Labs:  Recent Labs  01/04/15 0403 01/05/15 0555 01/06/15 0520  LABPROT 32.1* 26.7* 29.3*  INR 3.09* 2.44* 2.75*    Estimated Creatinine Clearance: 44.7 mL/min (by C-G formula based on Cr of 0.8).   Medical History: Past Medical History  Diagnosis Date  . Glaucoma   . Depression   . Pulmonary embolus   . Psychosis   . Hyperlipidemia   . Vitamin B12 deficiency   . Vitamin D deficiency   . 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 Dementia     Assessment: 89 [facility] at [facility].  She has h/o dementia and on warfarin for h/o DVT.   CT head and maxillofacial negative for bleed. Pharmacy asked to dose warfarin  PTA warfarin dosing:  2.5mg  daily with 5mg  on Mondays   Today, 01/06/2015:   INR = 2.75 (therapeutic)  CBC (2/1): Hgb and pltc low from admission, but appeared to be stabilized  Drug - drug interactions: Mo major interactions  Diet:  Regular.  Charted as eating only small amounts. Reportedly has been agitated and confused.  No bleeding reported in chart note.  Goal of Therapy:  INR 2-3   Plan:   Warfarin 0.5 mg PO today at 6PM  Warfarin needs decreased, suspect this may be d/t decreased oral intake  Daily INR while inpatient  Juliette Alcideustin Domenik Trice, PharmD, BCPS.   Pager: [phone] 01/06/2015  8:40 AM

## 2015-01-06 NOTE — Progress Notes (Signed)
Full note to follow:  I had a long conversation with Ms. [name]'s son, [name], over the phone. She continues to be very agitated and confused and is unable to participate in conversation. We had a long discussion about concerns with her dementia and her ability to recover to any quality of life. [name] says that his goal would be to see if she could be ambulatory again. We discussed the option of comfort and even hospice care - he was fairly overwhelmed as he has never discussed this option before but he was very receptive and had many questions. After much discussion [name] seems to acknowledge her poor QOL and that they will consider options such as comfort focus and rehospitalization. He would like to proceed with SNF rehab with palliative to follow for progress and if she does not tolerate therapy we discussed a plan to return to memory care with hospice to follow (they are self pay at facility so Medicare could pay hospice). I am hopeful that palliative care at the rehab can assist them with options for the next step, continue conversation surrounding goals of care and symptom management.   As far as anxiety I would recommend scheduled doses of ativan 1 mg BID and increase to TID or increase dosage slowly as needed. Continue prn doses of haldol but would transition to po in the morning. Will leave prn ativan IV doses for now but would recommend to attempt to utilize haldol first. I will follow up in the morning.   Yong ChannelAlicia Guy Toney, NP Palliative Medicine Team Pager # [phone] (M-F 8a-5p) Team Phone # [phone] (Nights/Weekends)

## 2015-01-06 NOTE — Progress Notes (Addendum)
Patient ID: [name], female   DOB: 1927/03/29, 81 y.o.   MRN: [id number] [facility] PROGRESS NOTE  [name] KZS:[id number] DOB: [birth year] DOA: 01/01/2015 PCP: PROVIDER NOT IN SYSTEM  Brief narrative:    81 yr old female from skilled facility with a history of dementia, pulmonary embolism, hyperlipidemia who presented to [facility] ED status post lethargy, found on floor in nursing home. On admission, she was found to have UTI and elevated troponin level.  Barrier to discharge: due to agitation, risk of falls patient has Air cabin crew at the bedside which limits disposition plan to SNF unless no sitter in 24 hour period. So far patient has required chemical restraint, sitter at bedside. No physical restraints applied.   Assessment/Plan:    Principal Problem: Sepsis secondary to E.Coli UTI / leukocytosis  - Sepsis criteria met on admission with hypotension, leukocytosis, tachypnea, tachycardia and source of infection, E.Coli UTI - Patient was initially on IV Zosyn but we changed this antibiotic to Ancef on 01/05/2015 based on sensitivity report. - Obtain CBC tomorrow morning. - C.diff negative.  Active Problems: Acute metabolic encephalopathy / dementia - No significant improvement in mental status since the admission. Apparently family reported this to be baseline mental status. - palliative care consulted for goals of care. Appreciate their input. - Continue to use alternating Ativan and Haldol to control agitation and confusion.  Elevated troponin - likely demand ischemia from sepsidue to urinary tract infection. - troponin level trended down to 0.67 - 12 lead EKG with no acute ischemic changes   History of DVT / PE - Continue anticoagulation with Coumadin, pharmacy dosing.   Essential hypertension - blood pressure is 162/91. She is on low dose metoprolol IV. We will change it to higher dose 5 mg IV and more frequently, every 6 hours.   Dyslipidemia -  continue statin therapy   Expiratory wheezing - Added DuoNeb nebulizer every 4 hours scheduled. She also has as needed nebulizer treatments for shortness of breath and wheezing.  Anxiety and depression - continue Zoloft, Remeron - Ativan and Haldol can be used if patient agitated and confused.    Thrombocytopenia - likely due to history of anticoagulation - last platelet count 121  DVT Prophylaxis  -  patient is on anticoagulation with Coumadin with therapeutic INR level.   Code Status: DNR/DNI Family Communication:  No family at the bedside Disposition Plaas noted above, because of agitation and risk of fall patient has safety sitter at the bedside which limits disposition plan to skilled nursing facility.  IV access:  Peripheral IV  Procedures and diagnostic studies:    No results found.  Medical Consultants:  Palliative care   Other Consultants:  Physical therapy  SLP  IAnti-Infectives:   Zosyn 01/01/2015 --> 01/04/2015 Ancef 01/04/2015 -->  Leisa Lenz, MD  [facility] Pager [phone]  If 7PM-7AM, please contact night-coverage [email or web address] Password [email or web address] 01/06/2015, 10:20 AM   LOS: 5 days    HPI/Subjective: No acute overnight events.  Objective: Filed Vitals:   01/05/15 0615 01/05/15 1223 01/05/15 1531 01/06/15 0417  BP: 117/86 123/90  162/91  Pulse: 68 62  77  Temp: 98 F (36.7 C) 98.7 F (37.1 C)  98.5 F (36.9 C)  TempSrc: Oral Oral  Oral  Resp: _0 Height:      Weight:      SpO2: 98% 91% 94% 90%    Intake/Output Summary (Last 24 hours) at 01/06/15 1020 Last data filed at 01/06/15 0900  Gross per 24 hour  Intake   1810 ml  Output   1450 ml  Net    360 ml    Exam:   General:  Pt is awake, more calm today, no distress  Cardiovascular: Regular rate and rhythm, S1/S2 appreciated  Respiratory: Bilateral air entry, expiratory wheezing mild in mid and upper lung lobes  Abdomen: Soft, non tender, non distended, bowel  sounds present  Extremities: Pulses palpable bilaterally, no lower extremity edema.  Neuro: No focal deficits  Data Reviewed: Basic Metabolic Panel:  Recent Labs Lab 01/01/15 0748 01/02/15 0711 01/03/15 0637  NA 141 144 141  K 4.0 3.6 3.9  CL 112 115* 117*  CO2 _0 GLUCOSE 168* 110* 109*  BUN 29* 20 17  CREATININE 1.07 0.84 0.80  CALCIUM 8.9 8.1* 7.9*   Liver Function Tests:  Recent Labs Lab 01/01/15 0748 01/02/15 0711  AST 93* 94*  ALT 31 40*  ALKPHOS 85 68  BILITOT 0.8 0.9  PROT 7.3 6.1  ALBUMIN 3.5 3.0*   No results for input(s): LIPASE, AMYLASE in the last 168 hours. No results for input(s): AMMONIA in the last 168 hours. CBC:  Recent Labs Lab 01/01/15 0748 01/02/15 0711 01/03/15 0637  WBC 14.3* 8.1 6.1  NEUTROABS 12.2*  --   --   HGB 12.4 10.5* 10.3*  HCT 38.2 32.9* 32.8*  MCV 97.9 98.2 100.6*  PLT 161 124* 121*   Cardiac Enzymes:  Recent Labs Lab 01/01/15 0037 01/01/15 0748 01/01/15 1143 01/01/15 1815  TROPONINI 0.41* 1.09* 1.11* 0.67*   BNP: Invalid input(s): POCBNP CBG: No results for input(s): GLUCAP in the last 168 hours.  Recent Results (from the past 240 hour(s))  Urine culture     Status: None   Collection Time: 01/01/15  8:01 AM  Result Value Ref Range Status   Specimen Description URINE, CATHETERIZED  Final   Special Requests Immunocompromised  Final   Colony Count   Final    >=100,000 COLONIES/ML Performed at [facility]    Culture   Final    ESCHERICHIA COLI Performed at [facility]    Report Status 01/04/2015 FINAL  Final   Organism ID, Bacteria ESCHERICHIA COLI  Final      Susceptibility   Escherichia coli - MIC*    AMPICILLIN >=32 RESISTANT Resistant     CEFAZOLIN <=4 SENSITIVE Sensitive     CEFTRIAXONE <=1 SENSITIVE Sensitive     CIPROFLOXACIN >=4 RESISTANT Resistant     GENTAMICIN >=16 RESISTANT Resistant     LEVOFLOXACIN >=8 RESISTANT Resistant     NITROFURANTOIN <=16 SENSITIVE  Sensitive     TOBRAMYCIN 8 INTERMEDIATE Intermediate     TRIMETH/SULFA <=20 SENSITIVE Sensitive     PIP/TAZO <=4 SENSITIVE Sensitive     * ESCHERICHIA COLI  MRSA PCR Screening     Status: Abnormal   Collection Time: 01/01/15  1:40 PM  Result Value Ref Range Status   MRSA by PCR POSITIVE (A) NEGATIVE Final    Comment:        The GeneXpert MRSA Assay (FDA approved for NASAL specimens only), is one component of a comprehensive MRSA colonization surveillance program. It is not intended to diagnose MRSA infection nor to guide or monitor treatment for MRSA infections. RESULT CALLED TO, READ BACK BY AND VERIFIED WITH: Theora Master [id number] @ 51 BY J SCOTTON   Clostridium Difficile by PCR     Status: None   Collection Time: 01/04/15 12:57 PM  Result Value Ref Range Status   C difficile by pcr NEGATIVE NEGATIVE Final    Comment: Performed at [facility]     Scheduled Meds: . atorvastatin  20 mg Oral QHS  .  ceFAZolin (ANCEF) IV  1 g Intravenous 3 times per day  . Chlorhexidine Gluconate Cloth  6 each Topical q morning - 10a  . docusate sodium  100 mg Oral BID  . feeding supplement (ENSURE COMPLETE)  237 mL Oral TID BM  . HYDROcodone-acetaminophen  1 tablet Oral BID  . ipratropium-albuterol  3 mL Nebulization Q4H  . latanoprost  1 drop Both Eyes QHS  . metoprolol  2.5 mg Intravenous 3 times per day  . mirtazapine  30 mg Oral QHS  . mupirocin ointment  1 application Nasal BID  . pantoprazole  40 mg Oral Daily  . polyvinyl alcohol  1 drop Right Eye BID  . sertraline  25 mg Oral BH-q7a  . Warfarin - Pharmacist Dosing Inpatient   Does not apply q1800   Continuous Infusions: . sodium chloride 50 mL/hr (01/04/15 1749)

## 2015-01-06 NOTE — Progress Notes (Signed)
Clinical Social Work  CSW reviewed chart and spoke with son Link Snuffer(Eddie) who reports that he wants patient to go to SNF with palliative following. CSW provided bed offers but [facility] was [facility] or [facility]. CSW explained that [facility] and [facility] had not made offers on patient at this time and that son should focus on bed offers provided. Son reports he will tour facilities and call CSW back with decision.  CSW spoke with medical team re: patient needing to be sitter free for 24 hours prior to DC.  CSW will continue to follow.  ColonyHolly Chance Munter, [place] [phone]

## 2015-01-06 NOTE — Consult Note (Signed)
Patient [name]      DOB: 24-Jun-1927      MVH:[id number]     Consult [facility] from the Pall[facility] Medicine Team at [facility]    Consult Requested by: Dr. Elisabeth Pigeonevine     PCP: PROVIDER NOT IN SYSTEM Reason for Consultation: GOC     Phone Number:None  Assessment of patients Current state: I had a long conversation with Ms. Eliberto IvoryCousanca's son, Link Snufferddie, over the phone. She continues to be very agitated and confused and is unable to participate in conversation. We had a long discussion about concerns with her dementia and her ability to recover to any quality of life. Link Snufferddie says that his goal would be to see if she could be ambulatory again. We discussed the option of comfort and even hospice care - he was fairly overwhelmed as he has never discussed this option before but he was very receptive and had many questions. After much discussion Link Snufferddie seems to acknowledge her poor QOL and that they will consider options such as comfort focus and rehospitalization. He would like to proceed with SNF rehab with palliative to follow for progress and if she does not tolerate therapy we discussed a plan to return to memory care with hospice to follow (they are self pay at facility so Medicare could pay hospice). I am hopeful that palliative care at the rehab can assist them with options for the next step, continue conversation surrounding goals of care and symptom management.   As far as anxiety I would recommend scheduled doses of ativan 1 mg BID and increase to TID or increase dosage slowly as needed. Continue prn doses of haldol but would transition to po in the morning. Will leave prn ativan IV doses for now but would recommend to attempt to utilize haldol first. I will follow up in the morning.    Goals of Care: 1.  Code Status: DNR   2. Disposition: Discussed in detail with son. He wishes to try SNF rehab but understands she may not do well. PLEASE RECOMMEND PALLIATIVE TO FOLLOW AT SNF - this will be  important to help him transition to hospice if she does not do well in rehab.    3. Symptom Management:   1. Anxiety/Agitation: I will schedule ativan 1 mg po BID. May increase/decrease dose depending on how she responds. I will follow up in the morning.   4. Psychosocial: Emotional support provided to patient and to family over the phone during difficult conversation.    Brief HPI: 81 yo female admitted d/t fall and found to have UTI. She also underlying dementia with severe agitation at baseline. She makes reference to wanting to die and wanting to "jump out the window" according to RN but family says she has been talking this way for ~8 years. This is a very sad situation. PMH reviewed below.    ROS: Unable to assess - confused/agitated.     PMH:  Past Medical History  Diagnosis Date  . Glaucoma   . Depression   . Pulmonary embolus   . Psychosis   . Hyperlipidemia   . Vitamin B12 deficiency   . Vitamin D deficiency   . 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 Dementia      WUX:[id number]:History reviewed. No pertinent past surgical history. I have reviewed the FH and SH and  If appropriate update it with new information. Allergies  Allergen Reactions  . Tuberculin Tests     [facility]  Scheduled Meds: . atorvastatin  20 mg Oral QHS  .  ceFAZolin (ANCEF) IV  1 g Intravenous 3 times per day  . Chlorhexidine Gluconate Cloth  6 each Topical q morning - 10a  . docusate sodium  100 mg Oral BID  . feeding supplement (ENSURE COMPLETE)  237 mL Oral TID BM  . HYDROcodone-acetaminophen  1 tablet Oral BID  . ipratropium-albuterol  3 mL Nebulization Q4H  . latanoprost  1 drop Both Eyes QHS  . metoprolol  2.5 mg Intravenous 3 times per day  . mirtazapine  30 mg Oral QHS  . mupirocin ointment  1 application Nasal BID  . pantoprazole  40 mg Oral Daily  . polyvinyl alcohol  1 drop Right Eye BID  . sertraline  25 mg Oral BH-q7a  . warfarin  0.5 mg Oral ONCE-1800  . Warfarin -  Pharmacist Dosing Inpatient   Does not apply q1800   Continuous Infusions: . sodium chloride 50 mL/hr at 01/06/15 1225   PRN Meds:.acetaminophen, haloperidol lactate, ipratropium-albuterol, LORazepam, ondansetron **OR** ondansetron (ZOFRAN) IV    BP 162/91 mmHg  Pulse 77  Temp(Src) 98.5 F (36.9 C) (Oral)  Resp 18  Ht  (1.575 m)  Wt 62.5 kg (137 lb 12.6 oz)  BMI 25.20 kg/m2  SpO2 90%  LMP  (LMP Unknown)   PPS: 20%   Intake/Output Summary (Last 24 hours) at 01/06/15 1317 Last data filed at 01/06/15 1230  Gross per 24 hour  Intake   1930 ml  Output   1450 ml  Net    480 ml   LBM: 2/4  Physical Exam:  General: Severely agitated and attempting to get out of bed  HEENT: [place]/AT, moist mucous membranes Chest: No labored breathing, symmetric CVS: RRR Abdomen: Soft, NT, ND, +BS Ext: MAE, no edema, warm to touch Neuro: Awake, oriented only to self, severely agitated  Labs: CBC    Component Value Date/Time   WBC 6.1 01/03/2015 0637   RBC 3.26* 01/03/2015 0637   HGB 10.3* 01/03/2015 0637   HCT 32.8* 01/03/2015 0637   PLT 121* 01/03/2015 0637   MCV 100.6* 01/03/2015 0637   MCH 31.6 01/03/2015 0637   MCHC 31.4 01/03/2015 0637   RDW 14.5 01/03/2015 0637   LYMPHSABS 0.8 01/01/2015 0748   MONOABS 1.3* 01/01/2015 0748   EOSABS 0.0 01/01/2015 0748   BASOSABS 0.0 01/01/2015 0748    BMET    Component Value Date/Time   NA 141 01/03/2015 0637   K 3.9 01/03/2015 0637   CL 117* 01/03/2015 0637   CO2 20 01/03/2015 0637   GLUCOSE 109* 01/03/2015 0637   BUN 17 01/03/2015 0637   CREATININE 0.80 01/03/2015 0637   CALCIUM 7.9* 01/03/2015 0637   GFRNONAA 65* 01/03/2015 0637   GFRAA 76* 01/03/2015 0637    CMP     Component Value Date/Time   NA 141 01/03/2015 0637   K 3.9 01/03/2015 0637   CL 117* 01/03/2015 0637   CO2 20 01/03/2015 0637   GLUCOSE 109* 01/03/2015 0637   BUN 17 01/03/2015 0637   CREATININE 0.80 01/03/2015 0637   CALCIUM 7.9* 01/03/2015 0637    PROT 6.1 01/02/2015 0711   ALBUMIN 3.0* 01/02/2015 0711   AST 94* 01/02/2015 0711   ALT 40* 01/02/2015 0711   ALKPHOS 68 01/02/2015 0711   BILITOT 0.9 01/02/2015 0711   GFRNONAA 65* 01/03/2015 0637   GFRAA 76* 01/03/2015 0637     Time In Time Out Total Time Spent with  Patient Total Overall Time  1220 1340     Greater than 50%  of this time was spent counseling and coordinating care related to the above assessment and plan.   Yong Channel, NP Palliative Medicine Team Pager # [phone] (M-F 8a-5p) Team Phone # [phone] (Nights/Weekends)

## 2015-01-06 NOTE — Progress Notes (Signed)
Physical Therapy Treatment Patient Details Name: Mercedes SnowJeanette Christoffersen MRN: [id number] DOB: Sep 02, 1927 Today's Date: 01/06/2015    History of Present Illness 81 yo female admitted 01/02/15 after fall, [facility]. patient presented with hypotension, leukocytosis, tachypnea, tachycardia with abnormal UA    PT Comments    Pt with limited ability to participate today d/t lethargy, had Haldol and Ativan, although still with eyes open, responding to commands inconsistently and with incr time  Follow Up Recommendations  SNF;Supervision/Assistance - 24 hour     Equipment Recommendations  None recommended by PT    Recommendations for Other Services       Precautions / Restrictions Precautions Precautions: Fall Precaution Comments: multiple abrasions on legs , elbows, incontinence    Mobility  Bed Mobility Overal bed mobility: Needs Assistance Bed Mobility: Supine to Sit;Sit to Supine     Supine to sit: Mod assist Sit to supine: Mod assist   General bed mobility comments: extra time for activity  of sitting up, assist with legs onto bed.  Transfers Overall transfer level: Needs assistance   Transfers: Sit to/from Stand Sit to Stand: +2 physical assistance         General transfer comment: pt unable to stand with +2 assist, not bearing wt on LEs  Ambulation/Gait                 Stairs            Wheelchair Mobility    Modified Rankin (Stroke Patients Only)       Balance Overall balance assessment: Needs assistance Sitting-balance support: Single extremity supported;Bilateral upper extremity supported;No upper extremity supported;Feet supported;Feet unsupported Sitting balance-Leahy Scale: Poor Sitting balance - Comments: poor to fair; pt variable Postural control: Posterior lean                          Cognition Arousal/Alertness: Lethargic;Suspect due to medications Behavior During Therapy: Restless Overall Cognitive  Status: History of cognitive impairments - at baseline         Following Commands: Follows one step commands inconsistently            Exercises      General Comments        Pertinent Vitals/Pain Pain Assessment: Faces Faces Pain Scale: Hurts a little bit Pain Location: pt unable to state but grimaces Pain Intervention(s): Repositioned    Home Living                      Prior Function            PT Goals (current goals can now be found in the care plan section) Acute Rehab PT Goals PT Goal Formulation: Patient unable to participate in goal setting Time For Goal Achievement: 01/18/15 Potential to Achieve Goals: Fair Progress towards PT goals: Progressing toward goals    Frequency  Min 2X/week    PT Plan Current plan remains appropriate    Co-evaluation             End of Session   Activity Tolerance: Patient limited by lethargy Patient left: in bed;with call bell/phone within reach;with nursing/sitter in room;with bed alarm set     Time: [id number] PT Time Calculation (min) (ACUTE ONLY): 12 min  Charges:  $Therapeutic Activity: 8-22 mins                    G Codes:      Drucilla ChaletWILLIAMS,Ammar Moffatt  01/06/2015, 12:58 PM

## 2015-01-07 DIAGNOSIS — R451 Restlessness and agitation: Secondary | ICD-10-CM

## 2015-01-07 DIAGNOSIS — R778 Other specified abnormalities of plasma proteins: Secondary | ICD-10-CM | POA: Insufficient documentation

## 2015-01-07 DIAGNOSIS — R7989 Other specified abnormal findings of blood chemistry: Secondary | ICD-10-CM

## 2015-01-07 LAB — PROTIME-INR
INR: 2.88 — ABNORMAL HIGH (ref 0.00–1.49)
PROTHROMBIN TIME: 30.4 s — AB (ref 11.6–15.2)

## 2015-01-07 MED ORDER — LORAZEPAM 1 MG PO TABS: 1.0000 mg | ORAL_TABLET | Freq: Every day | ORAL | Status: DC

## 2015-01-07 MED ORDER — LORAZEPAM 1 MG PO TABS: 1.0000 mg | ORAL_TABLET | Freq: Two times a day (BID) | ORAL | Status: AC

## 2015-01-07 MED ORDER — WARFARIN 0.5 MG HALF TABLET
0.5000 mg | ORAL_TABLET | Freq: Once | ORAL | Status: DC
  Filled 2015-01-07: qty 1

## 2015-01-07 MED ORDER — LORAZEPAM 0.5 MG PO TABS: 0.5000 mg | ORAL_TABLET | Freq: Three times a day (TID) | ORAL | Status: AC | PRN

## 2015-01-07 MED ORDER — HALOPERIDOL 1 MG PO TABS
1.0000 mg | ORAL_TABLET | Freq: Three times a day (TID) | ORAL | Status: DC | PRN
  Filled 2015-01-07: qty 1

## 2015-01-07 MED ORDER — HALOPERIDOL 2 MG PO TABS: 2.0000 mg | ORAL_TABLET | Freq: Four times a day (QID) | ORAL | Status: AC | PRN

## 2015-01-07 MED ORDER — WARFARIN SODIUM 1 MG PO TABS: 0.5000 mg | ORAL_TABLET | Freq: Once | ORAL | Status: AC

## 2015-01-07 MED ORDER — HYDROCODONE-ACETAMINOPHEN 5-325 MG PO TABS: 1.0000 | ORAL_TABLET | Freq: Two times a day (BID) | ORAL | Status: AC

## 2015-01-07 MED ORDER — IPRATROPIUM-ALBUTEROL 0.5-2.5 (3) MG/3ML IN SOLN: 3.0000 mL | RESPIRATORY_TRACT | Status: AC | PRN

## 2015-01-07 MED ORDER — MIRTAZAPINE 30 MG PO TABS: 30.0000 mg | ORAL_TABLET | Freq: Every day | ORAL | Status: AC

## 2015-01-07 MED ORDER — ONDANSETRON HCL 4 MG PO TABS: 4.0000 mg | ORAL_TABLET | Freq: Four times a day (QID) | ORAL | Status: AC | PRN

## 2015-01-07 MED ORDER — LORAZEPAM 0.5 MG PO TABS
0.5000 mg | ORAL_TABLET | Freq: Every day | ORAL | Status: DC
  Administered 2015-01-07: 0.5 mg via ORAL
  Filled 2015-01-07: qty 1

## 2015-01-07 MED ORDER — TRAZODONE HCL 50 MG PO TABS: 50.0000 mg | ORAL_TABLET | Freq: Every evening | ORAL | Status: AC | PRN

## 2015-01-07 MED ORDER — HALOPERIDOL 2 MG PO TABS
2.0000 mg | ORAL_TABLET | Freq: Three times a day (TID) | ORAL | Status: DC | PRN
  Filled 2015-01-07: qty 1

## 2015-01-07 MED ORDER — SERTRALINE HCL 25 MG PO TABS: 25.0000 mg | ORAL_TABLET | ORAL | Status: AC

## 2015-01-07 MED ORDER — ENSURE COMPLETE PO LIQD: 237.0000 mL | Freq: Three times a day (TID) | ORAL | Status: AC

## 2015-01-07 NOTE — Discharge Instructions (Signed)

## 2015-01-07 NOTE — Progress Notes (Signed)
Progress Note from the Palliative Medicine Team at [facility]  Subjective: [name] is lying in bed (without clothes on - I pulled sheet over her to protect her dignity). She appears much more relaxed and at ease. She awakens to my voice and nods head yes that she has eaten breakfast and that she is okay.     Objective: Allergies  Allergen Reactions  . Tuberculin Tests     [facility]    Scheduled Meds: . atorvastatin  20 mg Oral QHS  .  ceFAZolin (ANCEF) IV  1 g Intravenous 3 times per day  . Chlorhexidine Gluconate Cloth  6 each Topical q morning - 10a  . docusate sodium  100 mg Oral BID  . feeding supplement (ENSURE COMPLETE)  237 mL Oral TID BM  . HYDROcodone-acetaminophen  1 tablet Oral BID  . ipratropium-albuterol  3 mL Nebulization BID  . latanoprost  1 drop Both Eyes QHS  . LORazepam  0.5 mg Oral Daily  . LORazepam  1 mg Oral QHS  . metoprolol  2.5 mg Intravenous 3 times per day  . mirtazapine  30 mg Oral QHS  . mupirocin ointment  1 application Nasal BID  . pantoprazole  40 mg Oral Daily  . polyvinyl alcohol  1 drop Right Eye BID  . sertraline  25 mg Oral Daily  . warfarin  0.5 mg Oral ONCE-1800  . warfarin  0.5 mg Oral ONCE-1800  . Warfarin - Pharmacist Dosing Inpatient   Does not apply q1800   Continuous Infusions: . sodium chloride 50 mL/hr at 01/06/15 1225   PRN Meds:.acetaminophen, haloperidol, ipratropium-albuterol, ondansetron **OR** ondansetron (ZOFRAN) IV  BP 154/80 mmHg  Pulse 10  Temp(Src) 97.4 F (36.3 C) (Axillary)  Resp 18  Ht 5\' 2"  (1.575 m)  Wt 62.5 kg (137 lb 12.6 oz)  BMI 25.20 kg/m2  SpO2 95%  LMP  (LMP Unknown)   PPS: 20%   Intake/Output Summary (Last 24 hours) at 01/07/15 1034 Last data filed at 01/07/15 0837  Gross per 24 hour  Intake   1490 ml  Output    300 ml  Net   1190 ml      LBM: 2/4  Physical Exam:  General: Lying in bed, appears to be resting comfortably  HEENT: [place]/AT, moist mucous membranes Chest: No  labored breathing, symmetric CVS: RRR Abdomen: Soft, NT, ND, +BS Ext: MAE, no edema, warm to touch Neuro: Awake, oriented only to self, more focused to answer questions but still confused   Labs: CBC    Component Value Date/Time   WBC 6.1 01/03/2015 0637   RBC 3.26* 01/03/2015 0637   HGB 10.3* 01/03/2015 0637   HCT 32.8* 01/03/2015 0637   PLT 121* 01/03/2015 0637   MCV 100.6* 01/03/2015 0637   MCH 31.6 01/03/2015 0637   MCHC 31.4 01/03/2015 0637   RDW 14.5 01/03/2015 0637   LYMPHSABS 0.8 01/01/2015 0748   MONOABS 1.3* 01/01/2015 0748   EOSABS 0.0 01/01/2015 0748   BASOSABS 0.0 01/01/2015 0748    BMET    Component Value Date/Time   NA 141 01/03/2015 0637   K 3.9 01/03/2015 0637   CL 117* 01/03/2015 0637   CO2 20 01/03/2015 0637   GLUCOSE 109* 01/03/2015 0637   BUN 17 01/03/2015 0637   CREATININE 0.80 01/03/2015 0637   CALCIUM 7.9* 01/03/2015 0637   GFRNONAA 65* 01/03/2015 0637   GFRAA 76* 01/03/2015 0637    CMP     Component Value  Date/Time   NA 141 01/03/2015 0637   K 3.9 01/03/2015 0637   CL 117* 01/03/2015 0637   CO2 20 01/03/2015 0637   GLUCOSE 109* 01/03/2015 0637   BUN 17 01/03/2015 0637   CREATININE 0.80 01/03/2015 0637   CALCIUM 7.9* 01/03/2015 0637   PROT 6.1 01/02/2015 0711   ALBUMIN 3.0* 01/02/2015 0711   AST 94* 01/02/2015 0711   ALT 40* 01/02/2015 0711   ALKPHOS 68 01/02/2015 0711   BILITOT 0.9 01/02/2015 0711   GFRNONAA 65* 01/03/2015 0637   GFRAA 76* 01/03/2015 0637    Assessment and Plan: 1. Code Status: DNR 2. Symptom Control: 1. Anxiety/Agitation: Improved this morning. RN reports that she was still trying to get out of bed at times last night but she did not require prn doses. I will keep ativan 1 mg po qhs but will decrease her daytime dose to ativan 0.5 mg po since she is sleepy this morning (although she does arouse easily to verbal stimulation). Haldol 1 mg po every 8 hours prn for breakthrough agitation. This regimen will need  follow up at [facility] for efficacy.  3. Psycho/Social: Emotional support provided to patient and family.  4. Disposition: SNF rehab with palliative to follow. Plan for possible transition to hospice from rehab.    Time In Time Out Total Time Spent with Patient Total Overall Time  0900 0930     Greater than 50%  of this time was spent counseling and coordinating care related to the above assessment and plan.  Yong Channel, NP Palliative Medicine Team Pager # [phone] (M-F 8a-5p) Team Phone # [phone] (Nights/Weekends)

## 2015-01-07 NOTE — Progress Notes (Signed)
ANTICOAGULATION CONSULT NOTE - follow up  Pharmacy Consult for warfarin  Indication: history of DVT, PE  Allergies  Allergen Reactions  . Tuberculin Tests     [facility]     Patient Measurements: Height: 5\' 2"  (157.5 cm) Weight: 137 lb 12.6 oz (62.5 kg) IBW/kg (Calculated) : 50.1  Vital Signs: Temp: 97.4 F (36.3 C) (02/05 0558) Temp Source: Axillary (02/05 0558) BP: 154/80 mmHg (02/05 0558) Pulse Rate: 10 (02/04 2110)  Labs:  Recent Labs  01/05/15 0555 01/06/15 0520 01/07/15 0550  LABPROT 26.7* 29.3* 30.4*  INR 2.44* 2.75* 2.88*    Estimated Creatinine Clearance: 44.7 mL/min (by C-G formula based on Cr of 0.8).   Medical History: Past Medical History  Diagnosis Date  . Glaucoma   . Depression   . Pulmonary embolus   . Psychosis   . Hyperlipidemia   . Vitamin B12 deficiency   . Vitamin D deficiency   . 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 Dementia     Assessment: [age] YOF [facility] being [facility] on floor at SNF.  She has h/o dementia and on warfarin for h/o DVT, PE.   CT head and maxillofacial negative for bleed. Pharmacy was asked to dose warfarin  PTA warfarin dosing:  2.5 mg daily except 5 mg on Mondays   Today, 01/07/2015:   Patient refused warfarin last night (dementia with intermittent severe agitation).  INR therapeutic, rising  Diet:  Regular.  Charted as eating only small amounts in setting of agitation and confusion.  Warfarin dose requirements are likely to remain smaller than her usual unless she is able to tolerate her typical diet.  Drug-drug interactions: no major interactions with warfarin on current drug regimen.  Concomitant IV antibiotic (Ancef) noted.  No bleeding reported in chart note.  Palliative care documentation of goals of care phone conversation with son noted.  DNR/DNI but continuing full medical support otherwise for now.    Goal of Therapy:  INR 2-3   Plan:  1. Warfarin 0.5 mg PO today  at [facility] if patient will take. 2. Daily INR while inpatient 3. Await any updates on goals of care.  Elie Goodyandy Abdou Stocks, PharmD, BCPS Pager: [phone] 01/07/2015  8:27 AM

## 2015-01-07 NOTE — Discharge Summary (Signed)
Physician Discharge Summary  [name] GEX:[id number] DOB: 1927-07-17 DOA: 01/01/2015  PCP: PROVIDER NOT IN SYSTEM  Admit date: 01/01/2015 Discharge date: 01/07/2015  Recommendations for Outpatient Follow-up:  1. Continue Coumadin as prescribed, 0.5 mg at bedtime. INR is 2.88 today 01/07/2015. Continue to monitor INR and keep range 2-3, adjust Coumadin dose accordingly. 2. Antibiotics are not needed at the time of discharge. Patient has received total of 7 days of IV antibiotics for treatment of UTI. 3. Please have palliative care follow patient in skilled nursing facility.  Discharge Diagnoses:  Principal Problem:   UTI (lower urinary tract infection) Active Problems:   Recurrent major depression-severe   Sepsis   Dementia with behavioral disturbance   History of DVT (deep vein thrombosis)   Palliative care encounter   Agitation   Elevated troponin    Discharge Condition: stable   Diet recommendation: as tolerated   History of present illness:  81 yr old female from skilled facility with a history of dementia, pulmonary embolism, hyperlipidemia who presented to [facility] ED status post lethargy, found on floor in nursing home. On admission, she was found to have UTI and elevated troponin level.  Sitter at bedside discontinued 01/06/2015. Patient medically stable.   Assessment/Plan:    Principal Problem: Sepsis secondary to E.Coli UTI / leukocytosis  - Sepsis criteria met on admission with hypotension, leukocytosis, tachypnea, tachycardia and source of infection, E.Coli UTI - Patient was initially on IV Zosyn but we changed this antibiotic to Ancef on 01/05/2015 based on sensitivity report. - Patient has received total of 1 week off IV antibiotics. She does not need to have antibiotics on discharge. - C.diff negative.  Active Problems: Acute metabolic encephalopathy / dementia - Patient does not have a sitter at the bedside. This was stopped 01/06/2015. - Mental  status stable. Ativan and Haldol seem to help with agitation and confusion. Prescription provided for those 2 medications.   Elevated troponin - Secondary to demand ischemia from sepsis secondary to urinary tract infection - troponin level trended down to 0.67 - 12 lead EKG with no acute ischemic changes   History of DVT / PE - Continue anticoagulation with Coumadin. - Coumadin 0.5 mg at bedtime is the current dose with therapeutic INR. As noted above, continue monitoring INR on daily basis and adjust Coumadin dose accordingly.  Essential hypertension - Patient will resume blood pressure medications as per prior home regimen.  Dyslipidemia - continue statin therapy   Expiratory wheezing - We'll continue nebulizer treatment in a nursing home as needed every 4-6 hours.  Anxiety and depression - continue Zoloft, Remeron - Ativan and Haldol use for agitation and confusion.  Thrombocytopenia - likely due to history of anticoagulation - last platelet count 121 - No evidence of bleeding.  DVT Prophylaxis  - patient is on anticoagulation with Coumadin with therapeutic INR level.   Code Status: DNR/DNI Family Communication: No family at the bedside  IV access:  Peripheral IV  Procedures and diagnostic studies:   No results found.  Medical Consultants:  Palliative care   Other Consultants:  Physical therapy  SLP  IAnti-Infectives:   Zosyn 01/01/2015 --> 01/04/2015 Ancef 01/04/2015 --> 01/07/2015    Signed:  Leisa Lenz, MD  [facility] 01/07/2015, 10:46 AM  Pager #: [phone]  Discharge Exam: Filed Vitals:   01/07/15 0558  BP: 154/80  Pulse:   Temp: 97.4 F (36.3 C)  Resp: 18   Filed Vitals:   01/06/15 2110 01/06/15 2131 01/07/15 0558 01/07/15 0730  BP: 154/91  154/80   Pulse: 10     Temp: 98.2 F (36.8 C)  97.4 F (36.3 C)   TempSrc: Oral  Axillary   Resp: 16  18   Height:      Weight:      SpO2: 94% 92% 95% 95%     General: Pt is not in acute distress Cardiovascular: Regular rate and rhythm, S1/S2 appreciated  Respiratory: no wheezing, no crackles, no rhonchi Abdominal: non distended, bowel sounds +, no guarding Extremities: no cyanosis, pulses palpable bilaterally DP and PT Neuro: Grossly nonfocal  Discharge Instructions  Discharge Instructions    Call MD for:  difficulty breathing, headache or visual disturbances    Complete by:  As directed      Call MD for:  persistant dizziness or light-headedness    Complete by:  As directed      Call MD for:  persistant nausea and vomiting    Complete by:  As directed      Call MD for:  severe uncontrolled pain    Complete by:  As directed      Diet - low sodium heart healthy    Complete by:  As directed      Discharge instructions    Complete by:  As directed   1. Continue Coumadin as prescribed, 0.5 mg at bedtime. INR is 2.88 today 01/07/2015. Continue to monitor INR and keep range 2-3, adjust Coumadin dose accordingly.     Increase activity slowly    Complete by:  As directed             Medication List    STOP taking these medications        cephALEXin 500 MG capsule  Commonly known as:  KEFLEX     diazepam 2 MG tablet  Commonly known as:  VALIUM     diazepam 5 MG tablet  Commonly known as:  VALIUM      TAKE these medications        acetaminophen 500 MG tablet  Commonly known as:  TYLENOL  Take 500 mg by mouth every 6 (six) hours as needed for mild pain.     atorvastatin 20 MG tablet  Commonly known as:  LIPITOR  Take 20 mg by mouth at bedtime.     docusate sodium 100 MG capsule  Commonly known as:  COLACE  Take 100 mg by mouth 2 (two) times daily.     ergocalciferol 50000 UNITS capsule  Commonly known as:  VITAMIN D2  Take 50,000 Units by mouth once a week.     feeding supplement (ENSURE COMPLETE) Liqd  Take 237 mLs by mouth 3 (three) times daily between meals.     haloperidol 2 MG tablet  Commonly known as:   HALDOL  Take 1 tablet (2 mg total) by mouth every 6 (six) hours as needed for agitation.     HYDROcodone-acetaminophen 5-325 MG per tablet  Commonly known as:  NORCO/VICODIN  Take 1 tablet by mouth 2 (two) times daily.     ipratropium-albuterol 0.5-2.5 (3) MG/3ML Soln  Commonly known as:  DUONEB  Take 3 mLs by nebulization every 4 (four) hours as needed.     LORazepam 0.5 MG tablet  Commonly known as:  ATIVAN  Take 1 tablet (0.5 mg total) by mouth every 8 (eight) hours as needed for anxiety.     LORazepam 1 MG tablet  Commonly known as:  ATIVAN  Take 1 tablet (1 mg total) by  mouth 2 (two) times daily.     metoprolol tartrate 25 MG tablet  Commonly known as:  LOPRESSOR  Take 25 mg by mouth 2 (two) times daily.     mirtazapine 30 MG tablet  Commonly known as:  REMERON  Take 1 tablet (30 mg total) by mouth daily. At 1700     ondansetron 4 MG tablet  Commonly known as:  ZOFRAN  Take 1 tablet (4 mg total) by mouth every 6 (six) hours as needed for nausea.     pantoprazole 40 MG tablet  Commonly known as:  PROTONIX  Take 40 mg by mouth daily.     polyvinyl alcohol 1.4 % ophthalmic solution  Commonly known as:  LIQUIFILM TEARS  Place 2 drops into the right eye 4 (four) times daily.     sertraline 25 MG tablet  Commonly known as:  ZOLOFT  Take 1 tablet (25 mg total) by mouth every morning.     TAB-A-VITE PO  Take 1 tablet by mouth daily.     traZODone 50 MG tablet  Commonly known as:  DESYREL  Take 1 tablet (50 mg total) by mouth at bedtime as needed for sleep.     vitamin B-12 1000 MCG tablet  Commonly known as:  CYANOCOBALAMIN  Take 1,000 mcg by mouth daily.     warfarin 1 MG tablet  Commonly known as:  COUMADIN  Take 0.5 tablets (0.5 mg total) by mouth one time only at 6 PM.           The results of significant diagnostics from this hospitalization (including imaging, microbiology, ancillary and laboratory) are listed below for reference.    Significant  Diagnostic Studies: Ct Head Wo Contrast  01/01/2015   CLINICAL DATA:  Pt unable to answer question unable to hold still  EXAM: CT HEAD WITHOUT CONTRAST  CT MAXILLOFACIAL WITHOUT CONTRAST  TECHNIQUE: Multidetector CT imaging of the head and maxillofacial structures were performed using the standard protocol without intravenous contrast. Multiplanar CT image reconstructions of the maxillofacial structures were also generated.  COMPARISON:  07/11/2014  FINDINGS: CT HEAD FINDINGS  Atherosclerotic and physiologic intracranial calcifications. Diffuse parenchymal atrophy. Patchy areas of progressive hypoattenuation in deep and periventricular white matter bilaterally. Negative for acute intracranial hemorrhage, mass lesion, acute infarction, midline shift, or mass-effect. Acute infarct may be inapparent on noncontrast CT. Ventricles and sulci symmetric. Bone windows demonstrate no focal lesion.  CT MAXILLOFACIAL FINDINGS  Retention cysts or polyps in bilateral maxillary sinuses. Paranasal sinuses are otherwise normally developed and well aerated. Negative for fracture. Mandible intact. Temporomandibular joints seated. Multiple dental restorations. Orbits and globes unremarkable. Mild spondylitic changes in the visualized lower cervical spine.  IMPRESSION: 1.   1.  Negative for bleed or other acute intracranial process. 2. Atrophy and nonspecific white matter changes. 3. Negative for fracture. 4. Bilateral maxillary sinus disease.   Electronically Signed   By: Arne Cleveland M.D.   On: 01/01/2015 09:06   Dg Knee Complete 4 Views Left  01/01/2015   CLINICAL DATA:  found on floor at nursing home crawling on floor. Unknown if pt fell. Pain and abrasions on anterior left knee  EXAM: LEFT KNEE - COMPLETE 4+ VIEW  COMPARISON:  None.  FINDINGS: Narrowing of the articular cartilage in the medial compartment with small marginal spurs. Chondrocalcinosis in medial and lateral compartments. Small marginal spur from the patellar  articular surface. Negative for fracture or dislocation. No effusion. Mild diffuse osteopenia.  IMPRESSION: 1. Negative for fracture  or other acute bony injury. 2. Degenerative changes most marked in the medial compartment, with chondrocalcinosis suggesting CPPD.   Electronically Signed   By: Arne Cleveland M.D.   On: 01/01/2015 08:55   Ct Maxillofacial Wo Cm  01/01/2015   CLINICAL DATA:  Pt unable to answer question unable to hold still  EXAM: CT HEAD WITHOUT CONTRAST  CT MAXILLOFACIAL WITHOUT CONTRAST  TECHNIQUE: Multidetector CT imaging of the head and maxillofacial structures were performed using the standard protocol without intravenous contrast. Multiplanar CT image reconstructions of the maxillofacial structures were also generated.  COMPARISON:  07/11/2014  FINDINGS: CT HEAD FINDINGS  Atherosclerotic and physiologic intracranial calcifications. Diffuse parenchymal atrophy. Patchy areas of progressive hypoattenuation in deep and periventricular white matter bilaterally. Negative for acute intracranial hemorrhage, mass lesion, acute infarction, midline shift, or mass-effect. Acute infarct may be inapparent on noncontrast CT. Ventricles and sulci symmetric. Bone windows demonstrate no focal lesion.  CT MAXILLOFACIAL FINDINGS  Retention cysts or polyps in bilateral maxillary sinuses. Paranasal sinuses are otherwise normally developed and well aerated. Negative for fracture. Mandible intact. Temporomandibular joints seated. Multiple dental restorations. Orbits and globes unremarkable. Mild spondylitic changes in the visualized lower cervical spine.  IMPRESSION: 1.   1.  Negative for bleed or other acute intracranial process. 2. Atrophy and nonspecific white matter changes. 3. Negative for fracture. 4. Bilateral maxillary sinus disease.   Electronically Signed   By: Arne Cleveland M.D.   On: 01/01/2015 09:06    Microbiology: Recent Results (from the past 240 hour(s))  Urine culture     Status: None    Collection Time: 01/01/15  8:01 AM  Result Value Ref Range Status   Specimen Description URINE, CATHETERIZED  Final   Special Requests Immunocompromised  Final   Colony Count   Final    >=100,000 COLONIES/ML Performed at [facility]    Culture   Final    ESCHERICHIA COLI Performed at [facility]    Report Status 01/04/2015 FINAL  Final   Organism ID, Bacteria ESCHERICHIA COLI  Final      Susceptibility   Escherichia coli - MIC*    AMPICILLIN >=32 RESISTANT Resistant     CEFAZOLIN <=4 SENSITIVE Sensitive     CEFTRIAXONE <=1 SENSITIVE Sensitive     CIPROFLOXACIN >=4 RESISTANT Resistant     GENTAMICIN >=16 RESISTANT Resistant     LEVOFLOXACIN >=8 RESISTANT Resistant     NITROFURANTOIN <=16 SENSITIVE Sensitive     TOBRAMYCIN 8 INTERMEDIATE Intermediate     TRIMETH/SULFA <=20 SENSITIVE Sensitive     PIP/TAZO <=4 SENSITIVE Sensitive     * ESCHERICHIA COLI  MRSA PCR Screening     Status: Abnormal   Collection Time: 01/01/15  1:40 PM  Result Value Ref Range Status   MRSA by PCR POSITIVE (A) NEGATIVE Final    Comment:        The GeneXpert MRSA Assay (FDA approved for NASAL specimens only), is one component of a comprehensive MRSA colonization surveillance program. It is not intended to diagnose MRSA infection nor to guide or monitor treatment for MRSA infections. RESULT CALLED TO, READ BACK BY AND VERIFIED WITH: Theora Master [id number] @ 41 BY J SCOTTON   Clostridium Difficile by PCR     Status: None   Collection Time: 01/04/15 12:57 PM  Result Value Ref Range Status   C difficile by pcr NEGATIVE NEGATIVE Final    Comment: Performed at [place]: Basic Metabolic  Panel:  Recent Labs Lab 01/01/15 0748 01/02/15 0711 01/03/15 0637  NA 141 144 141  K 4.0 3.6 3.9  CL 112 115* 117*  CO2 _0 GLUCOSE 168* 110* 109*  BUN 29* 20 17  CREATININE 1.07 0.84 0.80  CALCIUM 8.9 8.1* 7.9*   Liver Function Tests:  Recent Labs Lab  01/01/15 0748 01/02/15 0711  AST 93* 94*  ALT 31 40*  ALKPHOS 85 68  BILITOT 0.8 0.9  PROT 7.3 6.1  ALBUMIN 3.5 3.0*   No results for input(s): LIPASE, AMYLASE in the last 168 hours. No results for input(s): AMMONIA in the last 168 hours. CBC:  Recent Labs Lab 01/01/15 0748 01/02/15 0711 01/03/15 0637  WBC 14.3* 8.1 6.1  NEUTROABS 12.2*  --   --   HGB 12.4 10.5* 10.3*  HCT 38.2 32.9* 32.8*  MCV 97.9 98.2 100.6*  PLT 161 124* 121*   Cardiac Enzymes:  Recent Labs Lab 01/01/15 0037 01/01/15 0748 01/01/15 1143 01/01/15 1815  TROPONINI 0.41* 1.09* 1.11* 0.67*   BNP: BNP (last 3 results) No results for input(s): BNP in the last 8760 hours.  ProBNP (last 3 results) No results for input(s): PROBNP in the last 8760 hours.  CBG: No results for input(s): GLUCAP in the last 168 hours.  Time coordinating discharge: Over 30 minutes

## 2015-01-07 NOTE — Progress Notes (Signed)
Clinical Social Work  CSW spoke with son [name]([name]) who has [facility]. [facility] today. CSW prepared DC packet with FL2, DC summary, hard scripts, DNR form and [facility] forms. RN to call report. CSW updated son via phone of DC plans. Son prefers [facility] to provide transportation. Son reports he is hopeful that patient will participate in rehab so that she can start walking again and is relieved that palliative will follow in the facility as well. [facility] arranged for 2:30pm pick up. [facility] request #: [id number].  CSW is signing off but available if needed.  WatchungHolly Annelies Pennington, [place] [phone]

## 2015-01-10 ENCOUNTER — Non-Acute Institutional Stay (SKILLED_NURSING_FACILITY): Payer: Medicare Other | Admitting: Internal Medicine

## 2015-01-10 DIAGNOSIS — Z9181 History of falling: Secondary | ICD-10-CM

## 2015-01-10 DIAGNOSIS — F03918 Unspecified dementia, unspecified severity, with other behavioral disturbance: Secondary | ICD-10-CM

## 2015-01-10 DIAGNOSIS — F333 Major depressive disorder, recurrent, severe with psychotic symptoms: Secondary | ICD-10-CM

## 2015-01-10 DIAGNOSIS — A4151 Sepsis due to Escherichia coli [E. coli]: Secondary | ICD-10-CM

## 2015-01-10 DIAGNOSIS — Z9189 Other specified personal risk factors, not elsewhere classified: Secondary | ICD-10-CM

## 2015-01-10 DIAGNOSIS — R778 Other specified abnormalities of plasma proteins: Secondary | ICD-10-CM

## 2015-01-10 DIAGNOSIS — D696 Thrombocytopenia, unspecified: Secondary | ICD-10-CM

## 2015-01-10 DIAGNOSIS — I10 Essential (primary) hypertension: Secondary | ICD-10-CM

## 2015-01-10 DIAGNOSIS — Z86718 Personal history of other venous thrombosis and embolism: Secondary | ICD-10-CM

## 2015-01-10 DIAGNOSIS — E785 Hyperlipidemia, unspecified: Secondary | ICD-10-CM

## 2015-01-10 DIAGNOSIS — F0391 Unspecified dementia with behavioral disturbance: Secondary | ICD-10-CM

## 2015-01-10 DIAGNOSIS — R7989 Other specified abnormal findings of blood chemistry: Secondary | ICD-10-CM

## 2015-01-10 NOTE — Progress Notes (Signed)
MRN: [id number] Name: [name]  Sex: female Age: 81 y.o. DOB: 03/20/1927  [place] #: [facility] Facility/Room:308A Level Of Care: SNF Provider: Inocencio Homes D Emergency Contacts: Extended Emergency Contact Information Primary Emergency Contact: Gaxiola,Eddie Address: [address]          [place] [address] [place] Phone: [phone] Mobile Phone: [phone] Relation: Son Secondary Emergency Contact: Earl Gala [place] Phone: [phone] Relation: Daughter  Code Status: DNR  Allergies: Tuberculin tests  Chief Complaint  Patient presents with  . New Admit To SNF    HPI: Patient is 81 y.o. female who was hospitalized with urosepsis, has dementia with psychosis and agitation, admitted to SNF as residence and for OT/PT.  Past Medical History  Diagnosis Date  . Glaucoma   . Depression   . Pulmonary embolus   . Psychosis   . Vitamin B12 deficiency   . Vitamin D deficiency   . 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 Dementia   . Hyperlipidemia     History reviewed. No pertinent past surgical history.    Medication List       This list is accurate as of: 01/10/15 11:59 PM.  Always use your most recent med list.               acetaminophen 500 MG tablet  Commonly known as:  TYLENOL  Take 500 mg by mouth every 6 (six) hours as needed for mild pain.     atorvastatin 20 MG tablet  Commonly known as:  LIPITOR  Take 20 mg by mouth at bedtime.     docusate sodium 100 MG capsule  Commonly known as:  COLACE  Take 100 mg by mouth 2 (two) times daily.     ergocalciferol 50000 UNITS capsule  Commonly known as:  VITAMIN D2  Take 50,000 Units by mouth once a week.     feeding supplement (ENSURE COMPLETE) Liqd  Take 237 mLs by mouth 3 (three) times daily between meals.     haloperidol 2 MG tablet  Commonly known as:  HALDOL  Take 1 tablet (2 mg total) by mouth every 6 (six) hours as needed for  agitation.     HYDROcodone-acetaminophen 5-325 MG per tablet  Commonly known as:  NORCO/VICODIN  Take 1 tablet by mouth 2 (two) times daily.     ipratropium-albuterol 0.5-2.5 (3) MG/3ML Soln  Commonly known as:  DUONEB  Take 3 mLs by nebulization every 4 (four) hours as needed.     LORazepam 0.5 MG tablet  Commonly known as:  ATIVAN  Take 1 tablet (0.5 mg total) by mouth every 8 (eight) hours as needed for anxiety.     LORazepam 1 MG tablet  Commonly known as:  ATIVAN  Take 1 tablet (1 mg total) by mouth 2 (two) times daily.     metoprolol tartrate 25 MG tablet  Commonly known as:  LOPRESSOR  Take 25 mg by mouth 2 (two) times daily.     mirtazapine 30 MG tablet  Commonly known as:  REMERON  Take 1 tablet (30 mg total) by mouth daily. At 1700     ondansetron 4 MG tablet  Commonly known as:  ZOFRAN  Take 1 tablet (4 mg total) by mouth every 6 (six) hours as needed for nausea.     pantoprazole 40 MG tablet  Commonly known as:  PROTONIX  Take 40 mg by mouth daily.     polyvinyl  alcohol 1.4 % ophthalmic solution  Commonly known as:  LIQUIFILM TEARS  Place 2 drops into the right eye 4 (four) times daily.     sertraline 25 MG tablet  Commonly known as:  ZOLOFT  Take 1 tablet (25 mg total) by mouth every morning.     TAB-A-VITE PO  Take 1 tablet by mouth daily.     traZODone 50 MG tablet  Commonly known as:  DESYREL  Take 1 tablet (50 mg total) by mouth at bedtime as needed for sleep.     vitamin B-12 1000 MCG tablet  Commonly known as:  CYANOCOBALAMIN  Take 1,000 mcg by mouth daily.     warfarin 1 MG tablet  Commonly known as:  COUMADIN  Take 0.5 tablets (0.5 mg total) by mouth one time only at 6 PM.        No orders of the defined types were placed in this encounter.     There is no immunization history on file for this patient.  History  Substance Use Topics  . Smoking status: Never Smoker   . Smokeless tobacco: Not on file  . Alcohol Use: No     Family history is noncontributory    Review of Systems  UTO - per nursing, fall risk  .                        Filed Vitals:   01/10/15 1157  BP: 169/106  Pulse: 68  Temp: 97.8 F (36.6 C)  Resp: 18    Physical Exam  GENERAL APPEARANCE: Alert, nonconversant, agitated  SKIN: No diaphoresis rash HEAD: Normocephalic, atraumatic  EYES: Conjunctiva/lids clear. Pupils round, reactive. EOMs intact.  EARS: External exam WNL, canals clear. Hearing grossly normal.  NOSE: No deformity or discharge.  MOUTH/THROAT: Lips w/o lesions  RESPIRATORY: Breathing is even, unlabored. Lung sounds are clear   CARDIOVASCULAR: Heart RRR no murmurs, rubs or gallops. No peripheral edema.   GASTROINTESTINAL: Abdomen is soft, non-tender, not distended w/ normal bowel sounds. GENITOURINARY: Bladder non tender, not distended  MUSCULOSKELETAL: No abnormal joints or musculature NEUROLOGIC:  Cranial nerves 2-12 grossly intact. Moves all extremities  PSYCHIATRIC: dementia, agitation, does not process information or danger to self  Patient Active Problem List   Diagnosis Date Noted  . Benign essential HTN 01/11/2015  . Thrombocytopenia 01/11/2015  . Hyperlipidemia   . Elevated troponin   . Palliative care encounter 01/06/2015  . Agitation 01/06/2015  . Dementia with behavioral disturbance 01/04/2015  . History of DVT (deep vein thrombosis) 01/04/2015  . UTI (lower urinary tract infection) 01/01/2015  . Sepsis 01/01/2015  . Recurrent major depression-severe 05/25/2014  . Suicidal ideations 05/25/2014  . Bereavement 05/25/2014    CBC    Component Value Date/Time   WBC 6.1 01/03/2015 0637   RBC 3.26* 01/03/2015 0637   HGB 10.3* 01/03/2015 0637   HCT 32.8* 01/03/2015 0637   PLT 121* 01/03/2015 0637   MCV 100.6* 01/03/2015 0637   LYMPHSABS 0.8 01/01/2015 0748   MONOABS 1.3* 01/01/2015 0748   EOSABS 0.0 01/01/2015 0748   BASOSABS 0.0 01/01/2015 0748    CMP     Component Value Date/Time    NA 141 01/03/2015 0637   K 3.9 01/03/2015 0637   CL 117* 01/03/2015 0637   CO2 20 01/03/2015 0637   GLUCOSE 109* 01/03/2015 0637   BUN 17 01/03/2015 0637   CREATININE 0.80 01/03/2015 0637   CALCIUM 7.9* 01/03/2015 4742  PROT 6.1 01/02/2015 0711   ALBUMIN 3.0* 01/02/2015 0711   AST 94* 01/02/2015 0711   ALT 40* 01/02/2015 0711   ALKPHOS 68 01/02/2015 0711   BILITOT 0.9 01/02/2015 0711   GFRNONAA 65* 01/03/2015 0637   GFRAA 76* 01/03/2015 0637    Assessment and Plan  Sepsis secondary to E.Coli UTI / leukocytosis  - Sepsis criteria met on admission with hypotension, leukocytosis, tachypnea, tachycardia and source of infection, E.Coli UTI - Patient was initially on IV Zosyn but we changed this antibiotic to Ancef on 01/05/2015 based on sensitivity report. - Patient has received total of 1 week off IV antibiotics. She does not need to have antibiotics on discharge. - C.diff negative.   Dementia with behavioral disturbance Patient does not have a sitter at the bedside. This was stopped 01/06/2015. - Mental status stable. Reported Ativan and Haldol seem to help with agitation and confusion Pt is a big fall risk. I don't really understand why coumadin? And why coumadin so hard to control that it needs a daily INR ? Consulting pallative to open conversation to help resolve this mess    Elevated troponin Secondary to demand ischemia from sepsis secondary to urinary tract infection - troponin level trended down to 0.67 - 12 lead EKG with no acute ischemic changes    History of DVT (deep vein thrombosis) Recommended Continue anticoagulation with Coumadin. - Coumadin 0.5 mg at bedtime is the current dose with therapeutic INR. As noted above, continue monitoring INR on daily basis and adjust Coumadin dose accordingly. Pt is a fall risk. Actually she has already fallen. Need to meet with POA about this situation.Will start with pallative.   Benign essential HTN Stable on  metoprolol   Recurrent major depression-severe continue Zoloft, Remeron - Ativan and Haldol use for agitation and confusion   Hyperlipidemia Continue lipitor 20 mg     Hennie Duos, MD

## 2015-01-11 ENCOUNTER — Encounter: Payer: Self-pay | Admitting: Internal Medicine

## 2015-01-11 DIAGNOSIS — I10 Essential (primary) hypertension: Secondary | ICD-10-CM | POA: Insufficient documentation

## 2015-01-11 DIAGNOSIS — D696 Thrombocytopenia, unspecified: Secondary | ICD-10-CM | POA: Insufficient documentation

## 2015-01-11 DIAGNOSIS — Z9181 History of falling: Secondary | ICD-10-CM | POA: Insufficient documentation

## 2015-01-11 DIAGNOSIS — E785 Hyperlipidemia, unspecified: Secondary | ICD-10-CM | POA: Insufficient documentation

## 2015-01-11 NOTE — Assessment & Plan Note (Signed)
Recommended Continue anticoagulation with Coumadin. - Coumadin 0.5 mg at bedtime is the current dose with therapeutic INR. As noted above, continue monitoring INR on daily basis and adjust Coumadin dose accordingly. Pt is a fall risk. Actually she has already fallen. Need to meet with POA about this situation.Will start with pallative.

## 2015-01-11 NOTE — Assessment & Plan Note (Signed)
I personally watched pt's unending effort to climb out of reclining chair. This went on for hours. She finally had made it to front of chair and was preparing to stand up and fortunately I walked by at that moment and got her back in the chair and she was not happy about it. Need to get POA in here to have the discussion to stop coumadin and risk a clot. Bleed vs clot are the options.

## 2015-01-11 NOTE — Assessment & Plan Note (Signed)
secondary to E.Coli UTI / leukocytosis  - Sepsis criteria met on admission with hypotension, leukocytosis, tachypnea, tachycardia and source of infection, E.Coli UTI - Patient was initially on IV Zosyn but we changed this antibiotic to Ancef on 01/05/2015 based on sensitivity report. - Patient has received total of 1 week off IV antibiotics. She does not need to have antibiotics on discharge. - C.diff negative.

## 2015-01-11 NOTE — Assessment & Plan Note (Signed)
continue Zoloft, Remeron - Ativan and Haldol use for agitation and confusion

## 2015-01-11 NOTE — Assessment & Plan Note (Signed)
Continue lipitor 20 mg 

## 2015-01-11 NOTE — Assessment & Plan Note (Signed)
Patient does not have a sitter at the bedside. This was stopped 01/06/2015. - Mental status stable. Reported Ativan and Haldol seem to help with agitation and confusion Pt is a big fall risk. I don't really understand why coumadin? And why coumadin so hard to control that it needs a daily INR ? Consulting pallative to open conversation to help resolve this mess

## 2015-01-11 NOTE — Assessment & Plan Note (Addendum)
BP not controlled on metoprolol; start norvasc 10 mg

## 2015-01-11 NOTE — Assessment & Plan Note (Signed)
Secondary to demand ischemia from sepsis secondary to urinary tract infection - troponin level trended down to 0.67 - 12 lead EKG with no acute ischemic changes

## 2015-01-14 ENCOUNTER — Inpatient Hospital Stay (HOSPITAL_COMMUNITY)
Admission: EM | Admit: 2015-01-14 | Discharge: 2015-01-16 | DRG: 871 | Disposition: A | Discharge status: Skilled Nursing Facility | Payer: Medicare Other | Attending: Internal Medicine | Admitting: Internal Medicine

## 2015-01-14 ENCOUNTER — Emergency Department (HOSPITAL_COMMUNITY): Payer: Medicare Other

## 2015-01-14 ENCOUNTER — Encounter (HOSPITAL_COMMUNITY): Payer: Self-pay | Admitting: Emergency Medicine

## 2015-01-14 DIAGNOSIS — I4892 Unspecified atrial flutter: Secondary | ICD-10-CM | POA: Diagnosis present

## 2015-01-14 DIAGNOSIS — I5031 Acute diastolic (congestive) heart failure: Secondary | ICD-10-CM | POA: Diagnosis present

## 2015-01-14 DIAGNOSIS — J811 Chronic pulmonary edema: Secondary | ICD-10-CM | POA: Diagnosis present

## 2015-01-14 DIAGNOSIS — Y92129 Unspecified place in nursing home as the place of occurrence of the external cause: Secondary | ICD-10-CM | POA: Diagnosis not present

## 2015-01-14 DIAGNOSIS — E785 Hyperlipidemia, unspecified: Secondary | ICD-10-CM | POA: Diagnosis present

## 2015-01-14 DIAGNOSIS — Z7401 Bed confinement status: Secondary | ICD-10-CM

## 2015-01-14 DIAGNOSIS — F419 Anxiety disorder, unspecified: Secondary | ICD-10-CM | POA: Diagnosis present

## 2015-01-14 DIAGNOSIS — H409 Unspecified glaucoma: Secondary | ICD-10-CM | POA: Diagnosis present

## 2015-01-14 DIAGNOSIS — Z86711 Personal history of pulmonary embolism: Secondary | ICD-10-CM | POA: Diagnosis not present

## 2015-01-14 DIAGNOSIS — A4151 Sepsis due to Escherichia coli [E. coli]: Secondary | ICD-10-CM

## 2015-01-14 DIAGNOSIS — G9341 Metabolic encephalopathy: Secondary | ICD-10-CM | POA: Diagnosis present

## 2015-01-14 DIAGNOSIS — F0151 Vascular dementia with behavioral disturbance: Secondary | ICD-10-CM | POA: Diagnosis present

## 2015-01-14 DIAGNOSIS — R451 Restlessness and agitation: Secondary | ICD-10-CM | POA: Diagnosis present

## 2015-01-14 DIAGNOSIS — I4891 Unspecified atrial fibrillation: Secondary | ICD-10-CM | POA: Diagnosis present

## 2015-01-14 DIAGNOSIS — A419 Sepsis, unspecified organism: Secondary | ICD-10-CM | POA: Diagnosis present

## 2015-01-14 DIAGNOSIS — R6520 Severe sepsis without septic shock: Secondary | ICD-10-CM | POA: Diagnosis present

## 2015-01-14 DIAGNOSIS — Z87440 Personal history of urinary (tract) infections: Secondary | ICD-10-CM | POA: Diagnosis not present

## 2015-01-14 DIAGNOSIS — R627 Adult failure to thrive: Secondary | ICD-10-CM | POA: Diagnosis present

## 2015-01-14 DIAGNOSIS — Z79899 Other long term (current) drug therapy: Secondary | ICD-10-CM | POA: Diagnosis not present

## 2015-01-14 DIAGNOSIS — F0390 Unspecified dementia without behavioral disturbance: Secondary | ICD-10-CM

## 2015-01-14 DIAGNOSIS — Z66 Do not resuscitate: Secondary | ICD-10-CM | POA: Diagnosis present

## 2015-01-14 DIAGNOSIS — I10 Essential (primary) hypertension: Secondary | ICD-10-CM | POA: Diagnosis present

## 2015-01-14 DIAGNOSIS — Z515 Encounter for palliative care: Secondary | ICD-10-CM | POA: Diagnosis not present

## 2015-01-14 DIAGNOSIS — I471 Supraventricular tachycardia: Secondary | ICD-10-CM | POA: Diagnosis present

## 2015-01-14 DIAGNOSIS — W19XXXA Unspecified fall, initial encounter: Secondary | ICD-10-CM | POA: Diagnosis present

## 2015-01-14 DIAGNOSIS — J810 Acute pulmonary edema: Secondary | ICD-10-CM

## 2015-01-14 HISTORY — DX: Urinary tract infection, site not specified: N39.0

## 2015-01-14 HISTORY — DX: Other pulmonary embolism without acute cor pulmonale: I26.99

## 2015-01-14 HISTORY — DX: Anxiety disorder, unspecified: F41.9

## 2015-01-14 HISTORY — DX: Essential (primary) hypertension: I10

## 2015-01-14 LAB — BASIC METABOLIC PANEL
Anion gap: 11 (ref 5–15)
BUN: 10 mg/dL (ref 6–23)
CALCIUM: 8.9 mg/dL (ref 8.4–10.5)
CO2: 26 mmol/L (ref 19–32)
Chloride: 108 mmol/L (ref 96–112)
Creatinine, Ser: 0.89 mg/dL (ref 0.50–1.10)
GFR calc Af Amer: 67 mL/min — ABNORMAL LOW (ref >90)
GFR calc non Af Amer: 57 mL/min — ABNORMAL LOW (ref >90)
Glucose, Bld: 155 mg/dL — ABNORMAL HIGH (ref 70–99)
Potassium: 3.1 mmol/L — ABNORMAL LOW (ref 3.5–5.1)
Sodium: 145 mmol/L (ref 135–145)

## 2015-01-14 LAB — URINALYSIS, ROUTINE W REFLEX MICROSCOPIC
Bilirubin Urine: NEGATIVE
Bilirubin Urine: NEGATIVE
GLUCOSE, UA: NEGATIVE mg/dL
Glucose, UA: NEGATIVE mg/dL
Ketones, ur: NEGATIVE mg/dL
Ketones, ur: NEGATIVE mg/dL
NITRITE: NEGATIVE
Nitrite: NEGATIVE
PH: 7.5 (ref 5.0–8.0)
PROTEIN: 100 mg/dL — AB
Protein, ur: NEGATIVE mg/dL
Specific Gravity, Urine: 1.015 (ref 1.005–1.030)
Specific Gravity, Urine: 1.007 (ref 1.005–1.030)
Urobilinogen, UA: 0.2 mg/dL (ref 0.0–1.0)
Urobilinogen, UA: 0.2 mg/dL (ref 0.0–1.0)
pH: 8 (ref 5.0–8.0)

## 2015-01-14 LAB — RETICULOCYTES
RBC.: 3.94 MIL/uL (ref 3.87–5.11)
RETIC COUNT ABSOLUTE: 74.9 10*3/uL (ref 19.0–186.0)
Retic Ct Pct: 1.9 % (ref 0.4–3.1)

## 2015-01-14 LAB — I-STAT CG4 LACTIC ACID, ED: Lactic Acid, Venous: 1.76 mmol/L (ref 0.5–2.0)

## 2015-01-14 LAB — URINE MICROSCOPIC-ADD ON

## 2015-01-14 LAB — CBC WITH DIFFERENTIAL/PLATELET
Basophils Absolute: 0 10*3/uL (ref 0.0–0.1)
Basophils Relative: 0 % (ref 0–1)
Eosinophils Absolute: 0 10*3/uL (ref 0.0–0.7)
Eosinophils Relative: 0 % (ref 0–5)
HEMATOCRIT: 39.1 % (ref 36.0–46.0)
Hemoglobin: 12.5 g/dL (ref 12.0–15.0)
LYMPHS PCT: 4 % — AB (ref 12–46)
Lymphs Abs: 0.9 10*3/uL (ref 0.7–4.0)
MCH: 31.1 pg (ref 26.0–34.0)
MCHC: 32 g/dL (ref 30.0–36.0)
MCV: 97.3 fL (ref 78.0–100.0)
Monocytes Absolute: 1.5 10*3/uL — ABNORMAL HIGH (ref 0.1–1.0)
Monocytes Relative: 7 % (ref 3–12)
Neutro Abs: 20 10*3/uL — ABNORMAL HIGH (ref 1.7–7.7)
Neutrophils Relative %: 89 % — ABNORMAL HIGH (ref 43–77)
Platelets: 223 10*3/uL (ref 150–400)
RBC: 4.02 MIL/uL (ref 3.87–5.11)
RDW: 14.9 % (ref 11.5–15.5)
WBC: 22.5 10*3/uL — AB (ref 4.0–10.5)

## 2015-01-14 LAB — MRSA PCR SCREENING: MRSA by PCR: NEGATIVE

## 2015-01-14 LAB — MAGNESIUM: Magnesium: 1.7 mg/dL (ref 1.5–2.5)

## 2015-01-14 LAB — PROTIME-INR
INR: 1.23 (ref 0.00–1.49)
PROTHROMBIN TIME: 15.7 s — AB (ref 11.6–15.2)

## 2015-01-14 LAB — BRAIN NATRIURETIC PEPTIDE: B Natriuretic Peptide: 811.4 pg/mL — ABNORMAL HIGH (ref 0.0–100.0)

## 2015-01-14 LAB — LACTATE DEHYDROGENASE: LDH: 302 U/L — ABNORMAL HIGH (ref 94–250)

## 2015-01-14 MED ORDER — ENOXAPARIN SODIUM 40 MG/0.4ML ~~LOC~~ SOLN
40.0000 mg | SUBCUTANEOUS | Status: DC
  Administered 2015-01-14: 40 mg via SUBCUTANEOUS
  Filled 2015-01-14 (×2): qty 0.4

## 2015-01-14 MED ORDER — ONDANSETRON HCL 4 MG/2ML IJ SOLN: 4.0000 mg | Freq: Four times a day (QID) | INTRAMUSCULAR | Status: DC | PRN

## 2015-01-14 MED ORDER — FUROSEMIDE 10 MG/ML IJ SOLN
40.0000 mg | Freq: Once | INTRAMUSCULAR | Status: AC
  Administered 2015-01-15: 40 mg via INTRAVENOUS
  Filled 2015-01-14 (×2): qty 4

## 2015-01-14 MED ORDER — POLYVINYL ALCOHOL 1.4 % OP SOLN
2.0000 [drp] | Freq: Four times a day (QID) | OPHTHALMIC | Status: DC
  Administered 2015-01-14: 2 [drp] via OPHTHALMIC
  Filled 2015-01-14: qty 15

## 2015-01-14 MED ORDER — DILTIAZEM HCL 100 MG IV SOLR
5.0000 mg/h | INTRAVENOUS | Status: DC
  Administered 2015-01-14: 10 mg/h via INTRAVENOUS
  Administered 2015-01-14: 5 mg/h via INTRAVENOUS
  Administered 2015-01-14 – 2015-01-15 (×2): 10 mg/h via INTRAVENOUS
  Filled 2015-01-14 (×2): qty 100

## 2015-01-14 MED ORDER — LORAZEPAM 2 MG/ML IJ SOLN
0.5000 mg | Freq: Four times a day (QID) | INTRAMUSCULAR | Status: DC | PRN
  Administered 2015-01-14 – 2015-01-15 (×2): 0.5 mg via INTRAVENOUS
  Filled 2015-01-14 (×2): qty 1

## 2015-01-14 MED ORDER — LORAZEPAM 1 MG PO TABS: 1.0000 mg | ORAL_TABLET | Freq: Once | ORAL | Status: DC

## 2015-01-14 MED ORDER — ONDANSETRON HCL 4 MG PO TABS: 4.0000 mg | ORAL_TABLET | Freq: Four times a day (QID) | ORAL | Status: DC | PRN

## 2015-01-14 MED ORDER — VANCOMYCIN HCL 500 MG IV SOLR
500.0000 mg | Freq: Two times a day (BID) | INTRAVENOUS | Status: DC
  Administered 2015-01-14: 500 mg via INTRAVENOUS
  Filled 2015-01-14 (×2): qty 500

## 2015-01-14 MED ORDER — SODIUM CHLORIDE 0.9 % IV BOLUS (SEPSIS)
1000.0000 mL | Freq: Once | INTRAVENOUS | Status: AC
  Administered 2015-01-14: 1000 mL via INTRAVENOUS

## 2015-01-14 MED ORDER — METOPROLOL TARTRATE 25 MG PO TABS
25.0000 mg | ORAL_TABLET | Freq: Two times a day (BID) | ORAL | Status: DC
  Filled 2015-01-14 (×3): qty 1

## 2015-01-14 MED ORDER — PANTOPRAZOLE SODIUM 40 MG PO TBEC
40.0000 mg | DELAYED_RELEASE_TABLET | Freq: Every day | ORAL | Status: DC
  Filled 2015-01-14: qty 1

## 2015-01-14 MED ORDER — AMLODIPINE BESYLATE 10 MG PO TABS
10.0000 mg | ORAL_TABLET | Freq: Every day | ORAL | Status: DC
  Filled 2015-01-14 (×2): qty 1

## 2015-01-14 MED ORDER — GERHARDT'S BUTT CREAM
TOPICAL_CREAM | Freq: Three times a day (TID) | CUTANEOUS | Status: DC
  Administered 2015-01-14: 1 via TOPICAL
  Filled 2015-01-14: qty 1

## 2015-01-14 MED ORDER — IPRATROPIUM-ALBUTEROL 0.5-2.5 (3) MG/3ML IN SOLN: 3.0000 mL | RESPIRATORY_TRACT | Status: DC | PRN

## 2015-01-14 MED ORDER — SERTRALINE HCL 25 MG PO TABS
25.0000 mg | ORAL_TABLET | Freq: Every day | ORAL | Status: DC
  Filled 2015-01-14 (×2): qty 1

## 2015-01-14 MED ORDER — LORAZEPAM 1 MG PO TABS
1.0000 mg | ORAL_TABLET | Freq: Once | ORAL | Status: AC
  Administered 2015-01-14: 1 mg via ORAL
  Filled 2015-01-14: qty 1

## 2015-01-14 MED ORDER — VANCOMYCIN HCL IN DEXTROSE 1-5 GM/200ML-% IV SOLN
1000.0000 mg | Freq: Once | INTRAVENOUS | Status: AC
  Administered 2015-01-14: 1000 mg via INTRAVENOUS
  Filled 2015-01-14: qty 200

## 2015-01-14 MED ORDER — HYDROCODONE-ACETAMINOPHEN 5-325 MG PO TABS: 1.0000 | ORAL_TABLET | Freq: Four times a day (QID) | ORAL | Status: DC | PRN

## 2015-01-14 MED ORDER — HALOPERIDOL 2 MG PO TABS
2.0000 mg | ORAL_TABLET | Freq: Once | ORAL | Status: AC
  Administered 2015-01-14: 2 mg via ORAL
  Filled 2015-01-14: qty 1

## 2015-01-14 MED ORDER — HALOPERIDOL LACTATE 5 MG/ML IJ SOLN
1.0000 mg | Freq: Four times a day (QID) | INTRAMUSCULAR | Status: DC | PRN
  Administered 2015-01-14: 1 mg via INTRAVENOUS
  Filled 2015-01-14: qty 1

## 2015-01-14 MED ORDER — ATORVASTATIN CALCIUM 20 MG PO TABS
20.0000 mg | ORAL_TABLET | Freq: Every day | ORAL | Status: DC
  Filled 2015-01-14 (×2): qty 1

## 2015-01-14 MED ORDER — RISPERIDONE 0.5 MG PO TABS
0.5000 mg | ORAL_TABLET | Freq: Two times a day (BID) | ORAL | Status: DC | PRN
  Filled 2015-01-14: qty 1

## 2015-01-14 MED ORDER — POLYETHYLENE GLYCOL 3350 17 G PO PACK
17.0000 g | PACK | Freq: Every day | ORAL | Status: DC | PRN
Start: 2015-01-14 — End: 2015-01-15
  Filled 2015-01-14: qty 1

## 2015-01-14 MED ORDER — VANCOMYCIN HCL IN DEXTROSE 1-5 GM/200ML-% IV SOLN: 1000.0000 mg | Freq: Once | INTRAVENOUS | Status: DC

## 2015-01-14 MED ORDER — ACETAMINOPHEN 650 MG RE SUPP: 650.0000 mg | Freq: Four times a day (QID) | RECTAL | Status: DC | PRN

## 2015-01-14 MED ORDER — TRAZODONE HCL 50 MG PO TABS
50.0000 mg | ORAL_TABLET | Freq: Two times a day (BID) | ORAL | Status: DC | PRN
  Filled 2015-01-14: qty 1

## 2015-01-14 MED ORDER — ACETAMINOPHEN 325 MG PO TABS: 650.0000 mg | ORAL_TABLET | Freq: Four times a day (QID) | ORAL | Status: DC | PRN

## 2015-01-14 MED ORDER — ACETAMINOPHEN 325 MG PO TABS
650.0000 mg | ORAL_TABLET | Freq: Once | ORAL | Status: AC
  Administered 2015-01-14: 650 mg via ORAL
  Filled 2015-01-14: qty 2

## 2015-01-14 MED ORDER — MIRTAZAPINE 30 MG PO TABS
30.0000 mg | ORAL_TABLET | Freq: Every day | ORAL | Status: DC
  Filled 2015-01-14 (×2): qty 1

## 2015-01-14 MED ORDER — FUROSEMIDE 10 MG/ML IJ SOLN
40.0000 mg | Freq: Once | INTRAMUSCULAR | Status: AC
  Administered 2015-01-14: 40 mg via INTRAVENOUS
  Filled 2015-01-14: qty 4

## 2015-01-14 MED ORDER — HALOPERIDOL 2 MG PO TABS: 2.0000 mg | ORAL_TABLET | Freq: Once | ORAL | Status: DC

## 2015-01-14 MED ORDER — HALOPERIDOL 2 MG PO TABS
4.0000 mg | ORAL_TABLET | Freq: Every day | ORAL | Status: DC | PRN
  Filled 2015-01-14: qty 2

## 2015-01-14 MED ORDER — PROMETHAZINE HCL 25 MG PO TABS: 25.0000 mg | ORAL_TABLET | Freq: Four times a day (QID) | ORAL | Status: DC | PRN

## 2015-01-14 MED ORDER — PIPERACILLIN-TAZOBACTAM 3.375 G IVPB 30 MIN
3.3750 g | Freq: Once | INTRAVENOUS | Status: AC
Start: 2015-01-14 — End: 2015-01-14
  Administered 2015-01-14: 3.375 g via INTRAVENOUS
  Filled 2015-01-14: qty 50

## 2015-01-14 MED ORDER — PIPERACILLIN-TAZOBACTAM 3.375 G IVPB
3.3750 g | Freq: Three times a day (TID) | INTRAVENOUS | Status: DC
  Administered 2015-01-15: 3.375 g via INTRAVENOUS
  Filled 2015-01-14 (×3): qty 50

## 2015-01-14 MED ORDER — SODIUM CHLORIDE 0.9 % IV BOLUS (SEPSIS): 1000.0000 mL | INTRAVENOUS | Status: DC

## 2015-01-14 MED ORDER — VANCOMYCIN HCL 500 MG IV SOLR
500.0000 mg | Freq: Two times a day (BID) | INTRAVENOUS | Status: DC
  Filled 2015-01-14: qty 500

## 2015-01-14 NOTE — ED Provider Notes (Signed)
CSN: [id number]     Arrival date & time 01/14/15  0930 History   First MD Initiated Contact with Patient 01/14/15 0935     Chief Complaint  Patient presents with  . Fall     (Consider location/radiation/quality/duration/timing/severity/associated sxs/prior Treatment) HPI Comments: Unwittnessed fall at SNF. Coumadin was stopped yesterday.   Patient is a 81 y.o. female presenting with fall. The history is provided by the nursing home and the EMS personnel. The history is limited by the condition of the patient.  Fall This is a new problem. The problem has not changed since onset.Pertinent negatives include no chest pain, no abdominal pain, no headaches and no shortness of breath. Nothing aggravates the symptoms. Nothing relieves the symptoms. She has tried nothing for the symptoms. The treatment provided no relief.    Past Medical History  Diagnosis Date  . Anxiety   . Depression   . UTI (lower urinary tract infection)   . PE (pulmonary embolism)   . Hypertension   . Hyperlipidemia   . Dementia   . Glaucoma   . Vitamin B12 deficiency   . Vitamin D deficiency   . Psychosis    History reviewed. No pertinent past surgical history. History reviewed. No pertinent family history. History  Substance Use Topics  . Smoking status: Never Smoker   . Smokeless tobacco: Not on file  . Alcohol Use: No   OB History    No data available     Review of Systems  Constitutional: Negative for fever, chills, diaphoresis, activity change, appetite change and fatigue.  HENT: Negative for congestion, facial swelling, rhinorrhea and sore throat.   Eyes: Negative for photophobia and discharge.  Respiratory: Negative for cough, chest tightness and shortness of breath.   Cardiovascular: Negative for chest pain, palpitations and leg swelling.  Gastrointestinal: Negative for nausea, vomiting, abdominal pain and diarrhea.  Endocrine: Negative for polydipsia and polyuria.  Genitourinary: Negative for  dysuria, frequency, difficulty urinating and pelvic pain.  Musculoskeletal: Negative for back pain, arthralgias, neck pain and neck stiffness.  Skin: Negative for color change and wound.  Allergic/Immunologic: Negative for immunocompromised state.  Neurological: Negative for facial asymmetry, weakness, numbness and headaches.  Hematological: Does not bruise/bleed easily.  Psychiatric/Behavioral: Negative for confusion and agitation.      Allergies  Tuberculin tests  Home Medications   Prior to Admission medications   Medication Sig Start Date End Date Taking? Authorizing Provider  haloperidol lactate (HALDOL) 5 MG/ML injection Inject 0.4 mLs (2 mg total) into the vein 3 (three) times daily. 01/16/15   Shanker Levora DredgeM Ghimire, MD  haloperidol lactate (HALDOL) 5 MG/ML injection Inject 1 mL (5 mg total) into the vein every 6 (six) hours as needed. 01/16/15   Shanker Levora DredgeM Ghimire, MD  LORazepam (ATIVAN) 2 MG/ML injection Inject 0.5 mLs (1 mg total) into the vein every 4 (four) hours. 01/16/15   Shanker Levora DredgeM Ghimire, MD  LORazepam (ATIVAN) 2 MG/ML injection Inject 0.5-1 mLs (1-2 mg total) into the vein every 4 (four) hours as needed for anxiety. 01/16/15   Shanker Levora DredgeM Ghimire, MD  morphine 2 MG/ML injection Inject 1 mL (2 mg total) into the vein every hour as needed (SOB,SEDATION). 01/16/15   Shanker Levora DredgeM Ghimire, MD  Morphine Sulfate (MORPHINE CONCENTRATE) 10 MG/0.5ML SOLN concentrated solution Take 0.5 mLs (10 mg total) by mouth every 6 (six) hours. 01/16/15   Shanker Levora DredgeM Ghimire, MD   BP 147/97 mmHg  Pulse 122  Temp(Src) 97.9 F (36.6 C) (Axillary)  Resp 18  Ht  (1.6 m)  Wt 134 lb 7.7 oz (61 kg)  BMI 23.83 kg/m2  SpO2 96% Physical Exam  Constitutional: She is oriented to person, place, and time. She appears well-developed and well-nourished. No distress.  HENT:  Head: Normocephalic and atraumatic.  Mouth/Throat: No oropharyngeal exudate.  Eyes: Pupils are equal, round, and reactive to light.  Neck:  Normal range of motion. Neck supple.  Cardiovascular: Regular rhythm and normal heart sounds.  Tachycardia present.  Exam reveals no gallop and no friction rub.   No murmur heard. Pulmonary/Chest: Effort normal and breath sounds normal. No respiratory distress. She has no wheezes. She has no rales.  Abdominal: Soft. Bowel sounds are normal. She exhibits no distension and no mass. There is no tenderness. There is no rebound and no guarding.  Musculoskeletal: Normal range of motion. She exhibits no edema or tenderness.  Neurological: She is alert and oriented to person, place, and time.  Skin: Skin is warm and dry.  Psychiatric: She has a normal mood and affect.    ED Course  Procedures (including critical care time) Labs Review Labs Reviewed  CBC WITH DIFFERENTIAL/PLATELET - Abnormal; Notable for the following:    WBC 22.5 (*)    Neutrophils Relative % 89 (*)    Neutro Abs 20.0 (*)    Lymphocytes Relative 4 (*)    Monocytes Absolute 1.5 (*)    All other components within normal limits  BASIC METABOLIC PANEL - Abnormal; Notable for the following:    Potassium 3.1 (*)    Glucose, Bld 155 (*)    GFR calc non Af Amer 57 (*)    GFR calc Af Amer 67 (*)    All other components within normal limits  URINALYSIS, ROUTINE W REFLEX MICROSCOPIC - Abnormal; Notable for the following:    APPearance CLOUDY (*)    Hgb urine dipstick MODERATE (*)    Protein, ur 100 (*)    Leukocytes, UA SMALL (*)    All other components within normal limits  PROTIME-INR - Abnormal; Notable for the following:    Prothrombin Time 15.7 (*)    All other components within normal limits  URINE MICROSCOPIC-ADD ON - Abnormal; Notable for the following:    Squamous Epithelial / LPF MANY (*)    Bacteria, UA MANY (*)    All other components within normal limits  URINALYSIS, ROUTINE W REFLEX MICROSCOPIC - Abnormal; Notable for the following:    Hgb urine dipstick MODERATE (*)    Leukocytes, UA SMALL (*)    All other  components within normal limits  BRAIN NATRIURETIC PEPTIDE - Abnormal; Notable for the following:    B Natriuretic Peptide 811.4 (*)    All other components within normal limits  LACTATE DEHYDROGENASE - Abnormal; Notable for the following:    LDH 302 (*)    All other components within normal limits  BASIC METABOLIC PANEL - Abnormal; Notable for the following:    Sodium 147 (*)    Potassium 2.5 (*)    Glucose, Bld 129 (*)    Calcium 8.1 (*)    GFR calc non Af Amer 52 (*)    GFR calc Af Amer 61 (*)    All other components within normal limits  CBC - Abnormal; Notable for the following:    WBC 13.2 (*)    RBC 3.63 (*)    Hemoglobin 11.3 (*)    HCT 35.6 (*)    All other components within normal  limits  CBC - Abnormal; Notable for the following:    WBC 15.1 (*)    All other components within normal limits  URINE CULTURE  CULTURE, BLOOD (ROUTINE X 2)  CULTURE, BLOOD (ROUTINE X 2)  URINE CULTURE  MRSA PCR SCREENING  RESPIRATORY VIRUS PANEL  URINE MICROSCOPIC-ADD ON  MAGNESIUM  RETICULOCYTES  I-STAT CG4 LACTIC ACID, ED  I-STAT CG4 LACTIC ACID, ED    Imaging Review No results found.   EKG Interpretation   Date/Time:  Friday January 14 2015 11:27:49 EST Ventricular Rate:  139 PR Interval:  111 QRS Duration: 78 QT Interval:  308 QTC Calculation: 468 R Axis:   119 Text Interpretation:  Supraventricular tachycardia Multiform ventricular  premature complexes Probable lateral infarct, age indeterminate ED  PHYSICIAN INTERPRETATION AVAILABLE IN [facility] Confirmed by TEST,  Record ([id number]) on 01/16/2015 8:32:35 AM     Alternate MRN: [id number] MDM   Final diagnoses:  Fall  Sepsis, due to unspecified organism    Pt is a 81 y.o. female with Pmhx as above who presents with report of unwittnessed fall at SNF, 1 day after d/c-ing coumadin. Per EMS, pt at baseline mental status. On PE, pt complaining that she has to urinate, otherwise no complaints. She has abrasion to L  brow, is tachycardic, but VS otherwise stable.   Pt agitated, yelling. In chart review, she had another chart with admissions for UTI, falls, agitation. At last admission, was started on haldol/ativan for anxiety, agitation, will given home PO dose.   Pt Now febrile, tachycardic, with leukocytosis, meeting code sepsis criteria. Vanc/zosyn started. Unclear source at this point with possible early UTI. RVP also sent. [facility] will admit. CT head nml.     Toy Cookey, MD 01/17/15 [phone]

## 2015-01-14 NOTE — ED Notes (Signed)
Pt skin has multiple areas of wounds in various stages of healing; areas to elbows, knees, legs, arms, feet noted.  Buttocks/gleuteal fold large area of redness.  Changing pt hourly with barrier cream placed after cleansing and patting dry.

## 2015-01-14 NOTE — Progress Notes (Signed)
ANTIBIOTIC CONSULT NOTE - INITIAL  Pharmacy Consult for Vancomycin and Zosyn Indication: rule out sepsis  Allergies  Allergen Reactions  . Tuberculin Tests Other (See Comments)    unknown    Patient Measurements: Height: 5\' 3"  (160 cm) Weight: 140 lb (63.504 kg) IBW/kg (Calculated) : 52.4  Vital Signs: Temp: 101.7 F (38.7 C) (02/12 1113) Temp Source: Rectal (02/12 1113) BP: 159/71 mmHg (02/12 1115) Pulse Rate: 88 (02/12 1015) Intake/Output from previous day:   Intake/Output from this shift:    Labs:  Recent Labs  01/14/15 1033  WBC 22.5*  HGB 12.5  PLT 223   CrCl cannot be calculated (Unknown ideal weight.). No results for input(s): VANCOTROUGH, VANCOPEAK, VANCORANDOM, GENTTROUGH, GENTPEAK, GENTRANDOM, TOBRATROUGH, TOBRAPEAK, TOBRARND, AMIKACINPEAK, AMIKACINTROU, AMIKACIN in the last 72 hours.   Microbiology: No results found for this or any previous visit (from the past 720 hour(s)).  Medical History: Past Medical History  Diagnosis Date  . Anxiety   . Depression   . UTI (lower urinary tract infection)   . PE (pulmonary embolism)   . Hypertension   . Hyperlipidemia   . Dementia   . Glaucoma   . Vitamin B12 deficiency   . Vitamin D deficiency   . Psychosis     Medications:   (Not in a hospital admission) Scheduled:   Infusions:  . piperacillin-tazobactam    . sodium chloride    . sodium chloride    . sodium chloride    . vancomycin     Assessment: 81yo female with history of PE and dementia presents from SNF with fall. Pharmacy is consulted to dose vancomycin and zosyn for sepsis. Pt is febrile to 101.7, WBC 22.5, sCr 0.89.  Goal of Therapy:  Vancomycin trough level 15-20 mcg/ml  Plan:  Vancomycin 1g IV load followed by 500mg  q12h Zosyn 3.375g IV q8h Measure antibiotic drug levels at steady state Follow up culture results, renal function, and clinical course  Arlean HoppingCorey M. Newman PiesBall, PharmD Clinical Pharmacist Pager [phone] 01/14/2015,11:26  AM

## 2015-01-14 NOTE — ED Notes (Signed)
Trained Recruitment consultantsafety sitter at the bedside to prevent falls.

## 2015-01-14 NOTE — ED Notes (Signed)
Spoke with Dr. Micheline Mazeocherty about pts Chest x-ray showing Pulmonary edema before starting fluids. Md said to go ahead with fluids.

## 2015-01-14 NOTE — Progress Notes (Signed)
Called ED and attempted report

## 2015-01-14 NOTE — ED Notes (Signed)
Pt movement shows on cardiac monitor causing question to HR.  Apical heart rate 110 as auscultated by myself.  Remains in a fib with frequent pvc and couplets/

## 2015-01-14 NOTE — ED Notes (Signed)
Family at bedside. 

## 2015-01-14 NOTE — ED Notes (Signed)
Pt continues to try and sit up to get out of bed. Pts daughter at bedside. Pt encouraged to lay back and rest.

## 2015-01-14 NOTE — ED Notes (Signed)
Pt becoming more agitated. Pts respiratory rate increased and is now tachycardic with a rate of 180. Pt feels warm to touch. Rectal temp taken and is 101.7. MD notified.

## 2015-01-14 NOTE — ED Notes (Signed)
I stat lactic results called to Dr. Micheline Mazeocherty by B. Bing PlumeHaynes, EMT 1.76 mmol/L

## 2015-01-14 NOTE — ED Notes (Signed)
CT came to pick up pt, pt to combative at this time. This RN will contact CT when pt calms down.

## 2015-01-14 NOTE — H&P (Signed)
[facility] History and Physical                                                                                    [name], is a 81 y.o. female  MRN: [id number]   DOB - 09/03/1927  Admit Date - 01/14/2015  Outpatient Primary MD for the patient is No primary care provider on file.  With History of -  Past Medical History  Diagnosis Date  . Anxiety   . Depression   . UTI (lower urinary tract infection)   . PE (pulmonary embolism)   . Hypertension   . Hyperlipidemia   . Dementia   . Glaucoma   . Vitamin B12 deficiency   . Vitamin D deficiency   . Psychosis       History reviewed. No pertinent past surgical history.  in for   Chief Complaint  Patient presents with  . Fall     HPI  [name]  is a 81 y.o. female, with a past medical history of dementia, mental illness, PE and DVT who completed her Coumadin course yesterday (01/13/15). She lives in a skilled nursing facility and is bedbound. She was recently discharged from [facility] after having sepsis with urinary tract infection, and elevated troponin, and severe agitation after a fall (discharge date 01/07/15).  Today she was sent to the emergency department from [facility] skilled nursing facility after an unwitnessed fall. Fortunately her CT head and cervical spine are negative for acute abnormality. She is febrile, 101.7 rectally, agitated, demented, tachycardic in the 130s to 150s with SVT on her EKG, and pulmonary edema on her chest x-ray. Her daughter-in-law at bedside tells me that over the last several months her dementia has becomes significantly worse and she is agitated most of the time. She also relates that the family had a palliative medicine consult last week and that Haldol when necessary was prescribed. She feels that additional consultation with palliative medicine would be helpful for the family.  Blood cultures, and urine culture have been obtained and the patient is being  admitted under sepsis protocol to the stepdown unit.  Review of Systems   In addition to the HPI above,  Patient is unable to provide review of systems as she is severely demented  Social History History  Substance Use Topics  . Smoking status: Never Smoker   . Smokeless tobacco: Not on file  . Alcohol Use: No  She currently lives at skilled nursing facility and is bedbound   Family History Patient is unable to provide review of systems as she is severely demented   Prior to Admission medications   Medication Sig Start Date End Date Taking? Authorizing Provider  acetaminophen (TYLENOL) 500 MG tablet Take 500 mg by mouth every 6 (six) hours as needed for mild pain.   Yes Historical Provider, MD  amLODipine (NORVASC) 10 MG tablet Take 10 mg by mouth daily.   Yes Historical Provider, MD  atorvastatin (LIPITOR) 20 MG tablet Take 20 mg by mouth at bedtime.   Yes Historical Provider, MD  diazepam (VALIUM) 5 MG tablet Take 5 mg by mouth 2 (two) times daily as needed  for anxiety.   Yes Historical Provider, MD  docusate sodium (COLACE) 100 MG capsule Take 100 mg by mouth 2 (two) times daily.   Yes Historical Provider, MD  ergocalciferol (VITAMIN D2) 50000 UNITS capsule Take 50,000 Units by mouth once a week.   Yes Historical Provider, MD  haloperidol (HALDOL) 2 MG tablet Take 4 mg by mouth daily as needed for agitation.   Yes Historical Provider, MD  HYDROcodone-acetaminophen (NORCO/VICODIN) 5-325 MG per tablet Take 1 tablet by mouth every 6 (six) hours as needed for moderate pain.   Yes Historical Provider, MD  ipratropium-albuterol (DUONEB) 0.5-2.5 (3) MG/3ML SOLN Take 3 mLs by nebulization every 4 (four) hours as needed (cough, wheezing, or congestion).   Yes Historical Provider, MD  LORazepam (ATIVAN) 0.5 MG tablet Take 0.5 mg by mouth every 8 (eight) hours as needed for anxiety.   Yes Historical Provider, MD  LORazepam (ATIVAN) 1 MG tablet Take 1 mg by mouth 2 (two) times daily as needed for  anxiety.   Yes Historical Provider, MD  metoprolol tartrate (LOPRESSOR) 25 MG tablet Take 25 mg by mouth 2 (two) times daily.   Yes Historical Provider, MD  mirtazapine (REMERON) 30 MG tablet Take 30 mg by mouth daily.   Yes Historical Provider, MD  Multiple Vitamins-Minerals (MULTIVITAMIN & MINERAL PO) Take 1 tablet by mouth daily.   Yes Historical Provider, MD  ondansetron (ZOFRAN) 4 MG tablet Take 4 mg by mouth every 6 (six) hours as needed for nausea or vomiting.   Yes Historical Provider, MD  pantoprazole (PROTONIX) 40 MG tablet Take 40 mg by mouth daily.   Yes Historical Provider, MD  polyvinyl alcohol (LIQUIFILM TEARS) 1.4 % ophthalmic solution Place 2 drops into the right eye 4 (four) times daily.   Yes Historical Provider, MD  promethazine (PHENERGAN) 25 MG tablet Take 25 mg by mouth every 6 (six) hours as needed for nausea or vomiting.   Yes Historical Provider, MD  risperiDONE (RISPERDAL) 0.5 MG tablet Take 0.5 mg by mouth 2 (two) times daily as needed (psychosis).   Yes Historical Provider, MD  sertraline (ZOLOFT) 25 MG tablet Take 25 mg by mouth daily.   Yes Historical Provider, MD  traZODone (DESYREL) 50 MG tablet Take 50 mg by mouth 3 times/day as needed-between meals & bedtime for sleep.   Yes Historical Provider, MD  vitamin B-12 (CYANOCOBALAMIN) 1000 MCG tablet Take 1,000 mcg by mouth daily.   Yes Historical Provider, MD  warfarin (COUMADIN) 1 MG tablet Take 0.5 mg by mouth daily.   Yes Historical Provider, MD    Allergies  Allergen Reactions  . Tuberculin Tests Other (See Comments)    unknown    Physical Exam  Vitals  Blood pressure 141/88, pulse 131, temperature 101.7 F (38.7 C), temperature source Rectal, resp. rate 25, height 5\' 3"  (1.6 m), weight 63.504 kg (140 lb), SpO2 90 %.   General:  Elderly, chronically ill-appearing female,  continuously moving in bed asking for [name] (her deceased sister). Daughter-in-law is at bedside trying to calm her.  Psych:   Demented, agitated.  Neuro:   Unable to follow commands.  ENT:  Ears and Eyes appear Normal, Conjunctivae clear  Neck:  Supple, No lymphadenopathy appreciated  Respiratory:  Coarse breath sounds bilaterally with significantly increased work of breathing.  Cardiac:  Tachycardic   Abdomen:   Soft, Non tender, Non distended,  No masses appreciated  Skin:  Multiple small bruises and abrasions, red irritated "yeasty" appearing gluteal area.  Extremities:  Lower extremities are cold and she does not move them. Upper extremities are warm and actively moved by the patient.  Data Review  CBC  Recent Labs Lab 01/14/15 1033  WBC 22.5*  HGB 12.5  HCT 39.1  PLT 223  MCV 97.3  MCH 31.1  MCHC 32.0  RDW 14.9  LYMPHSABS 0.9  MONOABS 1.5*  EOSABS 0.0  BASOSABS 0.0    Chemistries   Recent Labs Lab 01/14/15 1033  NA 145  K 3.1*  CL 108  CO2 26  GLUCOSE 155*  BUN 10  CREATININE 0.89  CALCIUM 8.9    Coagulation profile  Recent Labs Lab 01/14/15 1033  INR 1.23   Urinalysis    Component Value Date/Time   COLORURINE YELLOW 01/14/2015 0955   APPEARANCEUR CLOUDY* 01/14/2015 0955   LABSPEC 1.015 01/14/2015 0955   PHURINE 8.0 01/14/2015 0955   GLUCOSEU NEGATIVE 01/14/2015 0955   HGBUR MODERATE* 01/14/2015 0955   BILIRUBINUR NEGATIVE 01/14/2015 0955   KETONESUR NEGATIVE 01/14/2015 0955   PROTEINUR 100* 01/14/2015 0955   UROBILINOGEN 0.2 01/14/2015 0955   NITRITE NEGATIVE 01/14/2015 0955   LEUKOCYTESUR SMALL* 01/14/2015 0955    Imaging results:   Ct Head Wo Contrast  01/14/2015   CLINICAL DATA:  Unwitnessed fall this morning.  History of dementia.  EXAM: CT HEAD WITHOUT CONTRAST  CT CERVICAL SPINE WITHOUT CONTRAST  TECHNIQUE: Multidetector CT imaging of the head and cervical spine was performed following the standard protocol without intravenous contrast. Multiplanar CT image reconstructions of the cervical spine were also generated.  COMPARISON:  01/01/2015 ;  01/09/2014  FINDINGS: CT HEAD FINDINGS  Similar findings of advanced atrophy with sulcal prominence and centralized volume loss with commensurate ex vacuo dilatation of the ventricular system. Grossly unchanged scattered periventricular hypodensities compatible microvascular ischemic disease. The gray-white differentiation is otherwise well maintained without CT evidence of acute large territory infarct. No intraparenchymal or extra-axial mass or hemorrhage. Unchanged size and configuration of the ventricles and basilar cisterns. No midline shift. Intracranial atherosclerosis.  Limited visualization of the paranasal sinuses and mastoid air cells is normal. No air-fluid levels. Regional soft tissues appear normal. No displaced calvarial fracture.  CT CERVICAL SPINE FINDINGS  C1 to the superior endplate of T2 is imaged.  There is minimal (approximately 2 mm) of anterolisthesis of C3 upon C4 and C4 upon C5. The bilateral facets are normally aligned. The dens is normally positioned between the lateral masses of C1. Normal atlantodental and atlantoaxial articulations.  No fracture or static subluxation of the cervical spine. Cervical vertebral body heights are preserved. Prevertebral soft tissues are normal.  There is mild to moderate multilevel cervical spine DDD, worse at C6-C7 with disc space height loss, endplate irregularity and small posteriorly directed disc osteophyte complex at this location.  Atherosclerotic plaque within the bilateral carotid bulbs.  Scattered shotty bilateral cervical lymph nodes are individually not enlarged by size criteria with index left-sided level IIA lymph node measuring 0.6 cm in greatest short axis diameter (image 47, series 6). Normal noncontrast appearance of the thyroid gland. Limited visualization of lung apices demonstrates mild interlobular septal thickening.  IMPRESSION: 1. Atrophy and microvascular ischemic disease without acute intracranial process. 2. No fracture or static  subluxation of the cervical spine. 3. Mild to moderate multilevel cervical spine DDD, worse at C6-C7.   Electronically Signed   By: Simonne ComeJohn  Watts M.D.   On: 01/14/2015 13:27   Ct Cervical Spine Wo Contrast  01/14/2015   CLINICAL DATA:  Unwitnessed fall this morning.  History of dementia.  EXAM: CT HEAD WITHOUT CONTRAST  CT CERVICAL SPINE WITHOUT CONTRAST  TECHNIQUE: Multidetector CT imaging of the head and cervical spine was performed following the standard protocol without intravenous contrast. Multiplanar CT image reconstructions of the cervical spine were also generated.  COMPARISON:  01/01/2015 ; 01/09/2014  FINDINGS: CT HEAD FINDINGS  Similar findings of advanced atrophy with sulcal prominence and centralized volume loss with commensurate ex vacuo dilatation of the ventricular system. Grossly unchanged scattered periventricular hypodensities compatible microvascular ischemic disease. The gray-white differentiation is otherwise well maintained without CT evidence of acute large territory infarct. No intraparenchymal or extra-axial mass or hemorrhage. Unchanged size and configuration of the ventricles and basilar cisterns. No midline shift. Intracranial atherosclerosis.  Limited visualization of the paranasal sinuses and mastoid air cells is normal. No air-fluid levels. Regional soft tissues appear normal. No displaced calvarial fracture.  CT CERVICAL SPINE FINDINGS  C1 to the superior endplate of T2 is imaged.  There is minimal (approximately 2 mm) of anterolisthesis of C3 upon C4 and C4 upon C5. The bilateral facets are normally aligned. The dens is normally positioned between the lateral masses of C1. Normal atlantodental and atlantoaxial articulations.  No fracture or static subluxation of the cervical spine. Cervical vertebral body heights are preserved. Prevertebral soft tissues are normal.  There is mild to moderate multilevel cervical spine DDD, worse at C6-C7 with disc space height loss, endplate  irregularity and small posteriorly directed disc osteophyte complex at this location.  Atherosclerotic plaque within the bilateral carotid bulbs.  Scattered shotty bilateral cervical lymph nodes are individually not enlarged by size criteria with index left-sided level IIA lymph node measuring 0.6 cm in greatest short axis diameter (image 47, series 6). Normal noncontrast appearance of the thyroid gland. Limited visualization of lung apices demonstrates mild interlobular septal thickening.  IMPRESSION: 1. Atrophy and microvascular ischemic disease without acute intracranial process. 2. No fracture or static subluxation of the cervical spine. 3. Mild to moderate multilevel cervical spine DDD, worse at C6-C7.   Electronically Signed   By: Simonne Come M.D.   On: 01/14/2015 13:27   Dg Chest Port 1 View  01/14/2015   CLINICAL DATA:  Fall.  Dementia.  EXAM: PORTABLE CHEST - 1 VIEW  COMPARISON:  None.  FINDINGS: There is diffuse interstitial coarsening with cephalized blood flow. No significant pleural effusion. Mild cardiomegaly. Mild aortic tortuosity for age, without evidence of enlargement.  IMPRESSION: Pulmonary edema.   Electronically Signed   By: Marnee Spring M.D.   On: 01/14/2015 10:19    My personal review of EKG: SVT versus A. fib with RVR   Assessment & Plan  Principal Problem:   Sepsis Active Problems:   Agitation   SVT (supraventricular tachycardia)   Dementia   Pulmonary edema   Fall  Sepsis Uncertain etiology. She was recently treated for a urinary tract infection with 7 days of antibiotic therapy. Current UA appears contaminated.  I've requested and an out cath and reordered the urinalysis. Blood cultures are pending. Respiratory viral swab was ordered by the EDP.  The patient has been placed on Vanco and Zosyn per pharmacy. Antibiotics will be narrowed when a cause of her sepsis can be determined. She will be admitted to stepdown.  SVT versus rapid A. fib with RVR The patient was  started on a diltiazem drip which has improved her pulse rate somewhat in the ER. We will continue her home medications when it is  determined to be safe for the patient to swallow.  Pulmonary edema Likely due to rapid A. fib in combination with bolus fluids received in the ER. The patient received one dose of IV Lasix in the ER and her breathing is improving. A repeat dose of IV Lasix has been ordered for 2/13.  We will check a 2-D echocardiogram.  Dementia with agitation Palliative medicine consultation was held last week. The family was told that her dementia is progressing significantly. Hospice was discussed.  We will continue Haldol PRN, Ativan PRN, risperidone PRN. I have asked for palliative medicine consultation for symptom management of agitation. Further, the family requests further consultation regarding what to expect over the next few months. I anticipate the patient will be made comfort care during this hospitalization.  Bedside sitter has been ordered for safety.  PE/DVT The patient just finished her Coumadin regimen on 2/11. Coumadin has been discontinued.   DVT Prophylaxis: Lovenox  AM Labs Ordered, also please review Full Orders  Family Communication:   Daughter-in-law at bedside  Code Status:  DO NOT RESUSCITATE  Condition:  Guarded. Likely poor prognosis  Time spent in minutes : [address]  PA-C on 01/14/2015 at 6:50 PM  Between 7am to 7pm - Pager - [phone]  After 7pm go to [email or web address] - password [email or web address]  And look for the night coverage person covering me after hours  [facility]

## 2015-01-14 NOTE — ED Notes (Addendum)
Pt utilizing diapers to void. Has been voiding well, all diapers and peripads completely soaked with changing.  Unable to determine amount but has been very productive of urine.  Pt had soft bm with this diaper changing.  Tolerated all fairly well.  Has been uncooperative with in/out cath. Awaiting assistance.

## 2015-01-14 NOTE — ED Notes (Signed)
PEr EMs, pt coming from [facility]. Pt had an unwitnessed fall this morning without LOC at the facility, pt sent here for CT scan of head. Pt has hx of dementia. Pt at baseline per facility.

## 2015-01-14 NOTE — ED Notes (Signed)
Spoke with CT, Pt has calmed down. EMT to go with pt to CT.

## 2015-01-15 ENCOUNTER — Encounter (HOSPITAL_COMMUNITY): Payer: Self-pay

## 2015-01-15 DIAGNOSIS — F0391 Unspecified dementia with behavioral disturbance: Secondary | ICD-10-CM

## 2015-01-15 DIAGNOSIS — Z515 Encounter for palliative care: Secondary | ICD-10-CM

## 2015-01-15 DIAGNOSIS — R451 Restlessness and agitation: Secondary | ICD-10-CM

## 2015-01-15 DIAGNOSIS — A419 Sepsis, unspecified organism: Principal | ICD-10-CM

## 2015-01-15 DIAGNOSIS — I4891 Unspecified atrial fibrillation: Secondary | ICD-10-CM

## 2015-01-15 DIAGNOSIS — G9340 Encephalopathy, unspecified: Secondary | ICD-10-CM

## 2015-01-15 LAB — CBC
HEMATOCRIT: 35.6 % — AB (ref 36.0–46.0)
HEMATOCRIT: 38.1 % (ref 36.0–46.0)
Hemoglobin: 11.3 g/dL — ABNORMAL LOW (ref 12.0–15.0)
Hemoglobin: 12.3 g/dL (ref 12.0–15.0)
MCH: 31.1 pg (ref 26.0–34.0)
MCH: 31.5 pg (ref 26.0–34.0)
MCHC: 31.7 g/dL (ref 30.0–36.0)
MCHC: 32.3 g/dL (ref 30.0–36.0)
MCV: 98.1 fL (ref 78.0–100.0)
MCV: 97.7 fL (ref 78.0–100.0)
PLATELETS: 199 10*3/uL (ref 150–400)
Platelets: 178 10*3/uL (ref 150–400)
RBC: 3.63 MIL/uL — AB (ref 3.87–5.11)
RBC: 3.9 MIL/uL (ref 3.87–5.11)
RDW: 15.1 % (ref 11.5–15.5)
RDW: 15.1 % (ref 11.5–15.5)
WBC: 13.2 10*3/uL — AB (ref 4.0–10.5)
WBC: 15.1 10*3/uL — ABNORMAL HIGH (ref 4.0–10.5)

## 2015-01-15 LAB — BASIC METABOLIC PANEL
Anion gap: 10 (ref 5–15)
BUN: 9 mg/dL (ref 6–23)
CALCIUM: 8.1 mg/dL — AB (ref 8.4–10.5)
CHLORIDE: 106 mmol/L (ref 96–112)
CO2: 31 mmol/L (ref 19–32)
CREATININE: 0.96 mg/dL (ref 0.50–1.10)
GFR calc non Af Amer: 52 mL/min — ABNORMAL LOW (ref >90)
GFR, EST AFRICAN AMERICAN: 61 mL/min — AB (ref >90)
GLUCOSE: 129 mg/dL — AB (ref 70–99)
Potassium: 2.5 mmol/L — CL (ref 3.5–5.1)
SODIUM: 147 mmol/L — AB (ref 135–145)

## 2015-01-15 MED ORDER — MORPHINE SULFATE (CONCENTRATE) 10 MG/0.5ML PO SOLN
10.0000 mg | Freq: Four times a day (QID) | ORAL | Status: DC
  Administered 2015-01-15: 10 mg via ORAL
  Filled 2015-01-15: qty 0.5

## 2015-01-15 MED ORDER — PROMETHAZINE HCL 25 MG/ML IJ SOLN: 12.5000 mg | Freq: Four times a day (QID) | INTRAMUSCULAR | Status: DC | PRN

## 2015-01-15 MED ORDER — LORAZEPAM 2 MG/ML IJ SOLN
1.0000 mg | Freq: Four times a day (QID) | INTRAMUSCULAR | Status: DC | PRN
  Administered 2015-01-15: 1 mg via INTRAVENOUS
  Filled 2015-01-15: qty 1

## 2015-01-15 MED ORDER — HALOPERIDOL LACTATE 5 MG/ML IJ SOLN
2.0000 mg | Freq: Three times a day (TID) | INTRAMUSCULAR | Status: DC
  Administered 2015-01-15 – 2015-01-16 (×3): 2 mg via INTRAVENOUS
  Filled 2015-01-15: qty 0.4
  Filled 2015-01-15: qty 1
  Filled 2015-01-15: qty 0.4
  Filled 2015-01-15: qty 1
  Filled 2015-01-15: qty 0.4

## 2015-01-15 MED ORDER — POTASSIUM CHLORIDE 10 MEQ/100ML IV SOLN
10.0000 meq | INTRAVENOUS | Status: DC
  Administered 2015-01-15 (×4): 10 meq via INTRAVENOUS
  Filled 2015-01-15 (×3): qty 100

## 2015-01-15 MED ORDER — LORAZEPAM 2 MG/ML IJ SOLN
1.0000 mg | INTRAMUSCULAR | Status: DC
  Administered 2015-01-15 – 2015-01-16 (×4): 1 mg via INTRAVENOUS
  Filled 2015-01-15 (×4): qty 1

## 2015-01-15 MED ORDER — HALOPERIDOL LACTATE 5 MG/ML IJ SOLN
5.0000 mg | Freq: Four times a day (QID) | INTRAMUSCULAR | Status: DC | PRN
  Filled 2015-01-15: qty 1

## 2015-01-15 MED ORDER — HALOPERIDOL LACTATE 5 MG/ML IJ SOLN
2.0000 mg | Freq: Every day | INTRAMUSCULAR | Status: DC | PRN
  Administered 2015-01-15: 2 mg via INTRAVENOUS
  Filled 2015-01-15: qty 1

## 2015-01-15 MED ORDER — MORPHINE SULFATE 2 MG/ML IJ SOLN: 2.0000 mg | INTRAMUSCULAR | Status: DC | PRN

## 2015-01-15 MED ORDER — PANTOPRAZOLE SODIUM 40 MG IV SOLR
40.0000 mg | INTRAVENOUS | Status: DC
  Filled 2015-01-15: qty 40

## 2015-01-15 MED ORDER — LORAZEPAM 2 MG/ML IJ SOLN: 1.0000 mg | INTRAMUSCULAR | Status: DC | PRN

## 2015-01-15 NOTE — Progress Notes (Signed)
PT Cancellation Note  Patient Details Name: Lang SnowJeanette Hada MRN: [id number] DOB: May 12, 1927   Cancelled Treatment:    Reason Eval/Treat Not Completed: Medical issues which prohibited therapy (Pt with HR 125-130 at rest, received Haldol and only opening eyes for seconds then asleep again). [facility] she is total care for all ADLs, has been using a Nurse, adultHoyer lift for 1wk as pt inconsistent with weightbearing. Will attempt 2/15   Toney Sangabor, [facility] 01/15/2015, 10:30 AM Delaney MeigsMaija Tabor Syesha Thaw, PT [phone]

## 2015-01-15 NOTE — Progress Notes (Addendum)
PATIENT DETAILS Name: [name] Age: 81 y.o. Sex: female Date of Birth: Oct 06, 1927 Admit Date: 01/14/2015 Admitting Physician Hollice Espy, MD UEA:[id number], Randon Goldsmith, MD  Subjective: Seen twice today-in a.m. patient very lethargic, later this morning-confused and very agitated- family at bedside.  Assessment/Plan: Principal Problem:   Severe sepsis: Foci not evident, UA equivocal for UTI. Chest x-ray negative. Admitted and started on empiric vancomycin and Zosyn. Cultures drawn on 2/12 pending so far. Continues to be very encephalopathic. Patient has had multiple admissions in the recent past, rapid decline in her functional status. This M.D. spoke with both son and daughter at bedside extensively today, family has decided to transition to full comfort status. Hence all antibiotics, IV fluids, lab work have been discontinued. Comfort medications have been started. Transfer to medical surgical unit.  Active Problems:   Acute encephalopathy: Likely metabolic encephalopathy secondary to above. CT head negative. Underlying history of advanced dementia.Since being transitioned to comfort care, no further workup recommended at this time. Supportive care with as needed Ativan, morphine and Haldol.    Likely atrial flutter with RVR: Started on a Cardizem infusion, continues to be tachycardic. I have discontinued Cardizem infusion, full comfort measures.    Acute diastolic heart failure: Likely precipitated from above. Given IV Lasix on admission. Since being transitioned to comfort care, have discontinued the echocardiogram. Saline lock all fluids and provide supportive care.    History of pulmonary embolism/DVT: Patient apparently finished her Coumadin regimen on 2/11. No further anticoagulation needed at this point.    Ethics/palliative care: Long discussion with daughter and son at bedside. Apparently patient has had a rapid decline in functional status since this past  December. Discharge from [facility] on 01/07/15. Family aware that patient's overall prognosis is very poor, and that she has no quality of life at all. Given rapid decline in her functional status, repeated hospitalizations, advanced dementia with failure to thrive syndrome, she likely will deteriorate no matter what care we provided for her.Per Family, patient did not want any aggressive care, and on now agreeable to proceed with just comfort care status. I have discontinued all antibiotics, IV fluids IV Cardizem. No further lab work. Start morphine/Ativan and Haldol, follow clinical course. Disposition will depend on clinical course, but likely will need residential hospice.  Disposition: Remain inpatient  Antibiotics:  See below   Anti-infectives    Start     Dose/Rate Route Frequency Ordered Stop   01/15/15 0000  vancomycin (VANCOCIN) 500 mg in sodium chloride 0.9 % 100 mL IVPB  Status:  Discontinued     500 mg 100 mL/hr over 60 Minutes Intravenous Every 12 hours 01/14/15 1142 01/14/15 2020   01/15/15 0000  vancomycin (VANCOCIN) 500 mg in sodium chloride 0.9 % 100 mL IVPB  Status:  Discontinued     500 mg 100 mL/hr over 60 Minutes Intravenous Every 12 hours 01/14/15 2038 01/15/15 1328   01/14/15 1800  piperacillin-tazobactam (ZOSYN) IVPB 3.375 g  Status:  Discontinued     3.375 g 12.5 mL/hr over 240 Minutes Intravenous Every 8 hours 01/14/15 1142 01/15/15 1328   01/14/15 1145  vancomycin (VANCOCIN) IVPB 1000 mg/200 mL premix     1,000 mg 200 mL/hr over 60 Minutes Intravenous  Once 01/14/15 1142 01/14/15 1348   01/14/15 1130  piperacillin-tazobactam (ZOSYN) IVPB 3.375 g     3.375 g 100 mL/hr over 30 Minutes Intravenous  Once 01/14/15 1123 01/14/15 1248  01/14/15 1130  vancomycin (VANCOCIN) IVPB 1000 mg/200 mL premix  Status:  Discontinued     1,000 mg 200 mL/hr over 60 Minutes Intravenous  Once 01/14/15 1123 01/14/15 1128      DVT Prophylaxis: None-comfort care  Code  Status:  DNR  Family Communication Daughter/Son at bedside  Procedures:  None  CONSULTS:  None  Time spent 40 minutes-which includes 50% of the time with face-to-face with patient/ family and coordinating care related to the above assessment and plan.  MEDICATIONS: Scheduled Meds: . haloperidol lactate  2 mg Intravenous TID   Continuous Infusions:  PRN Meds:.acetaminophen **OR** acetaminophen, haloperidol lactate, ipratropium-albuterol, LORazepam, morphine injection, ondansetron **OR** ondansetron (ZOFRAN) IV    PHYSICAL EXAM: Vital signs in last 24 hours: Filed Vitals:   01/15/15 0417 01/15/15 0440 01/15/15 0700 01/15/15 1100  BP: 133/37  113/69 102/45  Pulse:   81 72  Temp:   97.8 F (36.6 C) 98.9 F (37.2 C)  TempSrc:   Axillary Axillary  Resp:   28 29  Height:      Weight:  61 kg (134 lb 7.7 oz)    SpO2:   97% 95%    Weight change:  Filed Weights   01/14/15 1134 01/14/15 2015 01/15/15 0440  Weight: 63.504 kg (140 lb) 61.9 kg (136 lb 7.4 oz) 61 kg (134 lb 7.7 oz)   Body mass index is 23.83 kg/(m^2).   Gen Exam: Confused, and distress. Attempting to get out of bed. Neck: Supple, No JVD.  Chest: B/L Clear. CVS: S1 S2 Regular, no murmurs.  Abdomen: soft, BS +, non tender, non distended.  Extremities: no edema, lower extremities warm to touch. Neurologic: Non Focal.    Intake/Output from previous day:  Intake/Output Summary (Last 24 hours) at 01/15/15 1411 Last data filed at 01/15/15 0700  Gross per 24 hour  Intake 405.58 ml  Output   2625 ml  Net -2219.42 ml     LAB RESULTS: CBC  Recent Labs Lab 01/14/15 1033 01/15/15 0310 01/15/15 0838  WBC 22.5* 13.2* 15.1*  HGB 12.5 11.3* 12.3  HCT 39.1 35.6* 38.1  PLT 223 178 199  MCV 97.3 98.1 97.7  MCH 31.1 31.1 31.5  MCHC 32.0 31.7 32.3  RDW 14.9 15.1 15.1  LYMPHSABS 0.9  --   --   MONOABS 1.5*  --   --   EOSABS 0.0  --   --   BASOSABS 0.0  --   --     Chemistries   Recent Labs Lab  01/14/15 1033 01/14/15 2150 01/15/15 0310  NA 145  --  147*  K 3.1*  --  2.5*  CL 108  --  106  CO2 26  --  31  GLUCOSE 155*  --  129*  BUN 10  --  9  CREATININE 0.89  --  0.96  CALCIUM 8.9  --  8.1*  MG  --  1.7  --     CBG: No results for input(s): GLUCAP in the last 168 hours.  GFR Estimated Creatinine Clearance: 35.4 mL/min (by C-G formula based on Cr of 0.96).  Coagulation profile  Recent Labs Lab 01/14/15 1033  INR 1.23    Cardiac Enzymes No results for input(s): CKMB, TROPONINI, MYOGLOBIN in the last 168 hours.  Invalid input(s): CK  Invalid input(s): POCBNP No results for input(s): DDIMER in the last 72 hours. No results for input(s): HGBA1C in the last 72 hours. No results for input(s): CHOL, HDL, LDLCALC, TRIG, CHOLHDL,  LDLDIRECT in the last 72 hours. No results for input(s): TSH, T4TOTAL, T3FREE, THYROIDAB in the last 72 hours.  Invalid input(s): FREET3  Recent Labs  01/14/15 2150  RETICCTPCT 1.9   No results for input(s): LIPASE, AMYLASE in the last 72 hours.  Urine Studies No results for input(s): UHGB, CRYS in the last 72 hours.  Invalid input(s): UACOL, UAPR, USPG, UPH, UTP, UGL, UKET, UBIL, UNIT, UROB, ULEU, UEPI, UWBC, URBC, UBAC, CAST, UCOM, BILUA  MICROBIOLOGY: Recent Results (from the past 240 hour(s))  MRSA PCR Screening     Status: None   Collection Time: 01/14/15  8:14 PM  Result Value Ref Range Status   MRSA by PCR NEGATIVE NEGATIVE Final    Comment:        The GeneXpert MRSA Assay (FDA approved for NASAL specimens only), is one component of a comprehensive MRSA colonization surveillance program. It is not intended to diagnose MRSA infection nor to guide or monitor treatment for MRSA infections.     RADIOLOGY STUDIES/RESULTS: Ct Head Wo Contrast  01/14/2015   CLINICAL DATA:  Unwitnessed fall this morning.  History of dementia.  EXAM: CT HEAD WITHOUT CONTRAST  CT CERVICAL SPINE WITHOUT CONTRAST  TECHNIQUE: Multidetector  CT imaging of the head and cervical spine was performed following the standard protocol without intravenous contrast. Multiplanar CT image reconstructions of the cervical spine were also generated.  COMPARISON:  01/01/2015 ; 01/09/2014  FINDINGS: CT HEAD FINDINGS  Similar findings of advanced atrophy with sulcal prominence and centralized volume loss with commensurate ex vacuo dilatation of the ventricular system. Grossly unchanged scattered periventricular hypodensities compatible microvascular ischemic disease. The gray-white differentiation is otherwise well maintained without CT evidence of acute large territory infarct. No intraparenchymal or extra-axial mass or hemorrhage. Unchanged size and configuration of the ventricles and basilar cisterns. No midline shift. Intracranial atherosclerosis.  Limited visualization of the paranasal sinuses and mastoid air cells is normal. No air-fluid levels. Regional soft tissues appear normal. No displaced calvarial fracture.  CT CERVICAL SPINE FINDINGS  C1 to the superior endplate of T2 is imaged.  There is minimal (approximately 2 mm) of anterolisthesis of C3 upon C4 and C4 upon C5. The bilateral facets are normally aligned. The dens is normally positioned between the lateral masses of C1. Normal atlantodental and atlantoaxial articulations.  No fracture or static subluxation of the cervical spine. Cervical vertebral body heights are preserved. Prevertebral soft tissues are normal.  There is mild to moderate multilevel cervical spine DDD, worse at C6-C7 with disc space height loss, endplate irregularity and small posteriorly directed disc osteophyte complex at this location.  Atherosclerotic plaque within the bilateral carotid bulbs.  Scattered shotty bilateral cervical lymph nodes are individually not enlarged by size criteria with index left-sided level IIA lymph node measuring 0.6 cm in greatest short axis diameter (image 47, series 6). Normal noncontrast appearance of  the thyroid gland. Limited visualization of lung apices demonstrates mild interlobular septal thickening.  IMPRESSION: 1. Atrophy and microvascular ischemic disease without acute intracranial process. 2. No fracture or static subluxation of the cervical spine. 3. Mild to moderate multilevel cervical spine DDD, worse at C6-C7.   Electronically Signed   By: Simonne Come M.D.   On: 01/14/2015 13:27   Ct Cervical Spine Wo Contrast  01/14/2015   CLINICAL DATA:  Unwitnessed fall this morning.  History of dementia.  EXAM: CT HEAD WITHOUT CONTRAST  CT CERVICAL SPINE WITHOUT CONTRAST  TECHNIQUE: Multidetector CT imaging of the head and cervical spine  was performed following the standard protocol without intravenous contrast. Multiplanar CT image reconstructions of the cervical spine were also generated.  COMPARISON:  01/01/2015 ; 01/09/2014  FINDINGS: CT HEAD FINDINGS  Similar findings of advanced atrophy with sulcal prominence and centralized volume loss with commensurate ex vacuo dilatation of the ventricular system. Grossly unchanged scattered periventricular hypodensities compatible microvascular ischemic disease. The gray-white differentiation is otherwise well maintained without CT evidence of acute large territory infarct. No intraparenchymal or extra-axial mass or hemorrhage. Unchanged size and configuration of the ventricles and basilar cisterns. No midline shift. Intracranial atherosclerosis.  Limited visualization of the paranasal sinuses and mastoid air cells is normal. No air-fluid levels. Regional soft tissues appear normal. No displaced calvarial fracture.  CT CERVICAL SPINE FINDINGS  C1 to the superior endplate of T2 is imaged.  There is minimal (approximately 2 mm) of anterolisthesis of C3 upon C4 and C4 upon C5. The bilateral facets are normally aligned. The dens is normally positioned between the lateral masses of C1. Normal atlantodental and atlantoaxial articulations.  No fracture or static subluxation  of the cervical spine. Cervical vertebral body heights are preserved. Prevertebral soft tissues are normal.  There is mild to moderate multilevel cervical spine DDD, worse at C6-C7 with disc space height loss, endplate irregularity and small posteriorly directed disc osteophyte complex at this location.  Atherosclerotic plaque within the bilateral carotid bulbs.  Scattered shotty bilateral cervical lymph nodes are individually not enlarged by size criteria with index left-sided level IIA lymph node measuring 0.6 cm in greatest short axis diameter (image 47, series 6). Normal noncontrast appearance of the thyroid gland. Limited visualization of lung apices demonstrates mild interlobular septal thickening.  IMPRESSION: 1. Atrophy and microvascular ischemic disease without acute intracranial process. 2. No fracture or static subluxation of the cervical spine. 3. Mild to moderate multilevel cervical spine DDD, worse at C6-C7.   Electronically Signed   By: Simonne ComeJohn  Watts M.D.   On: 01/14/2015 13:27   Dg Chest Port 1 View  01/14/2015   CLINICAL DATA:  Fall.  Dementia.  EXAM: PORTABLE CHEST - 1 VIEW  COMPARISON:  None.  FINDINGS: There is diffuse interstitial coarsening with cephalized blood flow. No significant pleural effusion. Mild cardiomegaly. Mild aortic tortuosity for age, without evidence of enlargement.  IMPRESSION: Pulmonary edema.   Electronically Signed   By: Marnee SpringJonathon  Watts M.D.   On: 01/14/2015 10:19    Jeoffrey MassedGHIMIRE,Anaih Brander, MD  [facility] Pager:[phone]  If 7PM-7AM, please contact night-coverage [email or web address] Password [email or web address] 01/15/2015, 2:11 PM   LOS: 1 day

## 2015-01-15 NOTE — Progress Notes (Signed)
Call[facility]  Anderson MaltaElizabeth Golding, DO Palliative Medicine

## 2015-01-15 NOTE — Progress Notes (Signed)
CRITICAL VALUE ALERT  Critical value received: Potassium 2.5  Date of notification:  01/15/15  Time of notification:  0547  Critical value read back: yes  Nurse who received alert:  Armida SansAlyce Delfina Schreurs, RN  MD notified (1st page):  Elray McgregorMary Lynch, NP  Time of first page:  [phone]  Responding MD:  Elray McgregorMary Lynch, NP  Time MD responded:  [phone]  Orders received. Will continue to monitor pt closely.

## 2015-01-15 NOTE — Consult Note (Addendum)
Palliative Medicine Team at [facility]  Date: 01/15/2015   Patient Name: [name]  DOB: Feb 09, 1927  MRN: [id number]  Age / Sex: 81 y.o., female   PCP: Hennie Duos, MD Referring Physician: Jonetta Osgood, MD  Active Problems: Principal Problem:   Sepsis Active Problems:   Agitation   SVT (supraventricular tachycardia)   Dementia   Pulmonary edema   Fall   HPI/Reason for Consultation: [name] is a 81 y.o. female with multiple chronic medical problems, severe vascular dementia and heart failure who was admitted from [facility] with Sepsis from probable but not confirmed UTI and poor immune system from failure to thrive. PMT consulted for hospice options and goals of care.   Participants in Discussion: Met with patient's son, daughter and SIL.    Advance Directive: no   Code Status Orders        Start     Ordered   01/14/15 2020  Do not attempt resuscitation (DNR)   Continuous    Question Answer Comment  In the event of cardiac or respiratory ARREST Do not call a "code blue"   In the event of cardiac or respiratory ARREST Do not perform Intubation, CPR, defibrillation or ACLS   In the event of cardiac or respiratory ARREST Use medication by any route, position, wound care, and other measures to relive pain and suffering. May use oxygen, suction and manual treatment of airway obstruction as needed for comfort.      01/14/15 2020    Advance Directive Documentation        Most Recent Value   Type of Advance Directive  -- [facility] form]   Pre-existing out of facility DNR order (yellow form or pink MOST form)     "MOST" Form in Place?        I have reviewed the medical record, interviewed the patient and family, and examined the patient. The following aspects are pertinent.  Past Medical History  Diagnosis Date  . Anxiety   . Depression   . UTI (lower urinary tract infection)   . PE (pulmonary embolism)   . Hypertension   . Hyperlipidemia   .  Dementia   . Glaucoma   . Vitamin B12 deficiency   . Vitamin D deficiency   . Psychosis    History   Social History  . Marital Status: Married    Spouse Name: N/A  . Number of Children: N/A  . Years of Education: N/A   Social History Main Topics  . Smoking status: Never Smoker   . Smokeless tobacco: Not on file  . Alcohol Use: No  . Drug Use: Not on file  . Sexual Activity: Not on file   Other Topics Concern  . None   Social History Narrative  . None   History reviewed. No pertinent family history. Scheduled Meds: . haloperidol lactate  2 mg Intravenous TID   Continuous Infusions:  PRN Meds:.acetaminophen **OR** acetaminophen, haloperidol lactate, ipratropium-albuterol, LORazepam, morphine injection, ondansetron **OR** ondansetron (ZOFRAN) IV Allergies  Allergen Reactions  . Tuberculin Tests Other (See Comments)    unknown   CBC:    Component Value Date/Time   WBC 15.1* 01/15/2015 0838   HGB 12.3 01/15/2015 0838   HCT 38.1 01/15/2015 0838   PLT 199 01/15/2015 0838   MCV 97.7 01/15/2015 0838   NEUTROABS 20.0* 01/14/2015 1033   LYMPHSABS 0.9 01/14/2015 1033   MONOABS 1.5* 01/14/2015 1033   EOSABS 0.0 01/14/2015 1033   BASOSABS 0.0 01/14/2015 1033  Comprehensive Metabolic Panel:    Component Value Date/Time   NA 147* 01/15/2015 0310   K 2.5* 01/15/2015 0310   CL 106 01/15/2015 0310   CO2 31 01/15/2015 0310   BUN 9 01/15/2015 0310   CREATININE 0.96 01/15/2015 0310   GLUCOSE 129* 01/15/2015 0310   CALCIUM 8.1* 01/15/2015 0310    Vital Signs: BP 102/45 mmHg  Pulse 72  Temp(Src) 98.9 F (37.2 C) (Axillary)  Resp 29  Ht _0  (1.6 m)  Wt 61 kg (134 lb 7.7 oz)  BMI 23.83 kg/m2  SpO2 95% Filed Weights   01/14/15 1134 01/14/15 2015 01/15/15 0440  Weight: 63.504 kg (140 lb) 61.9 kg (136 lb 7.4 oz) 61 kg (134 lb 7.7 oz)   02/12 0701 - 02/13 0700 In: 1455.6 [I.V.:1205.6; IV Piggyback:250] Out: 2625 [Urine:2625]  Physical Exam:  Frail, now  comfortable-previously very agitated. +rhonchi  Summary of Established Goals of Care and Medical Treatment Preferences FULL COMFORT CARE.  Family now accepting that she is approaching EOL- they are at the end of a very long, emotional and difficult road of chronic illness with their mother. She has had very severe vascular dementia with behavioral disturbance and per family has suffered terribly from this with multiple hospitalizations, memory care centers etc.. Her son has carried most of this burden and it is clear that there is conflict around this between her two children.  Patient actively dying. Prognosis <2 weeks.  Recommendations: 1. DNR 2. Needs a Hospice Facility ASAP- difficult to control agitation requiring scheduled medications.  3. D/C sitter- need to control agitation so this is not needed 4. Control dyspnea with opiates 5. Palliative Prophylaxis  I discussed above care plan with family in detail. Ideally need to see if we can move this weekend.  Time : 2:20-3:15 Time Total: 55 min Greater than 50%  of this time was spent counseling and coordinating care related to the above assessment and plan.  Signed by: Roma Schanz, DO  01/15/2015, 3:01 PM  Please contact Palliative Medicine Team phone at [phone] for questions and concerns.

## 2015-01-15 NOTE — Progress Notes (Signed)
Nutrition Brief Note  Patient identified on the Malnutrition Screening Tool (MST) Report.  Wt Readings from Last 15 Encounters:  01/15/15 134 lb 7.7 oz (61 kg)    Body mass index is 23.83 kg/(m^2). Patient meets criteria for normal based on current BMI.   Current diet order is NPO, patient is not alert enough to take PO's at this time. Labs and medications reviewed. Palliative Care team has been consulted per family request. Guarded prognosis per review of chart.  No nutrition interventions warranted at this time. If nutrition issues arise, please consult RD.   Joaquin CourtsKimberly Bethania Schlotzhauer, RD, LDN, CNSC Pager [phone] After Hours Pager [phone]

## 2015-01-15 NOTE — Progress Notes (Signed)
SLP Cancellation Note  Patient Details Name: Lang SnowJeanette Jeffcoat MRN: [id number] DOB: 1927/02/22   Cancelled treatment:       Reason Eval/Treat Not Completed: Fatigue/lethargy limiting ability to participate. Pt minimally responsive to oral care. Poor dentition and food residue in mouth make pt high risk for oral bacteria, aspiration. Will f/u as needed.    Granville Whitefield, Riley NearingBonnie Caroline 01/15/2015, 8:22 AM

## 2015-01-16 LAB — URINE CULTURE
Colony Count: NO GROWTH
Culture: NO GROWTH

## 2015-01-16 MED ORDER — MORPHINE SULFATE 2 MG/ML IJ SOLN: 2.0000 mg | INTRAMUSCULAR | Status: AC | PRN

## 2015-01-16 MED ORDER — HALOPERIDOL LACTATE 5 MG/ML IJ SOLN: 2.0000 mg | Freq: Three times a day (TID) | INTRAMUSCULAR | Status: AC

## 2015-01-16 MED ORDER — HALOPERIDOL LACTATE 5 MG/ML IJ SOLN: 5.0000 mg | Freq: Four times a day (QID) | INTRAMUSCULAR | Status: AC | PRN

## 2015-01-16 MED ORDER — LORAZEPAM 2 MG/ML IJ SOLN: 1.0000 mg | INTRAMUSCULAR | Status: AC | PRN

## 2015-01-16 MED ORDER — LORAZEPAM 2 MG/ML IJ SOLN: 1.0000 mg | INTRAMUSCULAR | Status: AC

## 2015-01-16 MED ORDER — MORPHINE SULFATE (CONCENTRATE) 10 MG/0.5ML PO SOLN: 10.0000 mg | Freq: Four times a day (QID) | ORAL | Status: AC

## 2015-01-16 NOTE — Progress Notes (Signed)
UR Completed.  [phone]  

## 2015-01-16 NOTE — Discharge Summary (Signed)
PATIENT DETAILS Name: [name] Age: 81 y.o. Sex: female Date of Birth: 11-Oct-1927 MRN: [id number]. Admitting Physician: Hollice Espy, MD WUJ:[id number], Randon Goldsmith, MD  Admit Date: 01/14/2015 Discharge date: 01/16/2015  Recommendations for Outpatient Follow-up:  1. Optimize comfort medications.   PRIMARY DISCHARGE DIAGNOSIS:  Principal Problem:   Sepsis Active Problems:   Agitation   SVT (supraventricular tachycardia)   Dementia   Pulmonary edema   Fall      PAST MEDICAL HISTORY: Past Medical History  Diagnosis Date  . Anxiety   . Depression   . UTI (lower urinary tract infection)   . PE (pulmonary embolism)   . Hypertension   . Hyperlipidemia   . Dementia   . Glaucoma   . Vitamin B12 deficiency   . Vitamin D deficiency   . Psychosis     DISCHARGE MEDICATIONS: Current Discharge Medication List    START taking these medications   Details  !! haloperidol lactate (HALDOL) 5 MG/ML injection Inject 0.4 mLs (2 mg total) into the vein 3 (three) times daily. Qty: 5 mL, Refills: 0    !! haloperidol lactate (HALDOL) 5 MG/ML injection Inject 1 mL (5 mg total) into the vein every 6 (six) hours as needed. Qty: 5 mL, Refills: 0    !! LORazepam (ATIVAN) 2 MG/ML injection Inject 0.5 mLs (1 mg total) into the vein every 4 (four) hours. Qty: 5 mL, Refills: 0    !! LORazepam (ATIVAN) 2 MG/ML injection Inject 0.5-1 mLs (1-2 mg total) into the vein every 4 (four) hours as needed for anxiety. Qty: 1 mL, Refills: 0    morphine 2 MG/ML injection Inject 1 mL (2 mg total) into the vein every hour as needed (SOB,SEDATION). Qty: 5 mL, Refills: 0    Morphine Sulfate (MORPHINE CONCENTRATE) 10 MG/0.5ML SOLN concentrated solution Take 0.5 mLs (10 mg total) by mouth every 6 (six) hours. Qty: 42 mL, Refills: 0     !! - Potential duplicate medications found. Please discuss with provider.    STOP taking these medications     acetaminophen (TYLENOL) 500 MG tablet      amLODipine (NORVASC) 10 MG tablet      atorvastatin (LIPITOR) 20 MG tablet      diazepam (VALIUM) 5 MG tablet      docusate sodium (COLACE) 100 MG capsule      ergocalciferol (VITAMIN D2) 50000 UNITS capsule      haloperidol (HALDOL) 2 MG tablet      HYDROcodone-acetaminophen (NORCO/VICODIN) 5-325 MG per tablet      ipratropium-albuterol (DUONEB) 0.5-2.5 (3) MG/3ML SOLN      LORazepam (ATIVAN) 0.5 MG tablet      LORazepam (ATIVAN) 1 MG tablet      metoprolol tartrate (LOPRESSOR) 25 MG tablet      mirtazapine (REMERON) 30 MG tablet      Multiple Vitamins-Minerals (MULTIVITAMIN & MINERAL PO)      ondansetron (ZOFRAN) 4 MG tablet      pantoprazole (PROTONIX) 40 MG tablet      polyvinyl alcohol (LIQUIFILM TEARS) 1.4 % ophthalmic solution      promethazine (PHENERGAN) 25 MG tablet      risperiDONE (RISPERDAL) 0.5 MG tablet      sertraline (ZOLOFT) 25 MG tablet      traZODone (DESYREL) 50 MG tablet      vitamin B-12 (CYANOCOBALAMIN) 1000 MCG tablet      warfarin (COUMADIN) 1 MG tablet         ALLERGIES:  Allergies  Allergen Reactions  . Tuberculin Tests Other (See Comments)    unknown    BRIEF HPI:  See H&P, Labs, Consult and Test reports for all details in brief, patient is a 81 y.o. female, with a past medical history of dementia, , PE and DVT who was just discharged from [facility] a few days back presented to the ED for worsening confusion, Fall. Found to have Afib RVR, Sepsis and admitted for further evaluation and treatment.  CONSULTATIONS:   Palliative care  PERTINENT RADIOLOGIC STUDIES: Ct Head Wo Contrast  01/14/2015   CLINICAL DATA:  Unwitnessed fall this morning.  History of dementia.  EXAM: CT HEAD WITHOUT CONTRAST  CT CERVICAL SPINE WITHOUT CONTRAST  TECHNIQUE: Multidetector CT imaging of the head and cervical spine was performed following the standard protocol without intravenous contrast. Multiplanar CT image reconstructions of the cervical spine  were also generated.  COMPARISON:  01/01/2015 ; 01/09/2014  FINDINGS: CT HEAD FINDINGS  Similar findings of advanced atrophy with sulcal prominence and centralized volume loss with commensurate ex vacuo dilatation of the ventricular system. Grossly unchanged scattered periventricular hypodensities compatible microvascular ischemic disease. The gray-white differentiation is otherwise well maintained without CT evidence of acute large territory infarct. No intraparenchymal or extra-axial mass or hemorrhage. Unchanged size and configuration of the ventricles and basilar cisterns. No midline shift. Intracranial atherosclerosis.  Limited visualization of the paranasal sinuses and mastoid air cells is normal. No air-fluid levels. Regional soft tissues appear normal. No displaced calvarial fracture.  CT CERVICAL SPINE FINDINGS  C1 to the superior endplate of T2 is imaged.  There is minimal (approximately 2 mm) of anterolisthesis of C3 upon C4 and C4 upon C5. The bilateral facets are normally aligned. The dens is normally positioned between the lateral masses of C1. Normal atlantodental and atlantoaxial articulations.  No fracture or static subluxation of the cervical spine. Cervical vertebral body heights are preserved. Prevertebral soft tissues are normal.  There is mild to moderate multilevel cervical spine DDD, worse at C6-C7 with disc space height loss, endplate irregularity and small posteriorly directed disc osteophyte complex at this location.  Atherosclerotic plaque within the bilateral carotid bulbs.  Scattered shotty bilateral cervical lymph nodes are individually not enlarged by size criteria with index left-sided level IIA lymph node measuring 0.6 cm in greatest short axis diameter (image 47, series 6). Normal noncontrast appearance of the thyroid gland. Limited visualization of lung apices demonstrates mild interlobular septal thickening.  IMPRESSION: 1. Atrophy and microvascular ischemic disease without acute  intracranial process. 2. No fracture or static subluxation of the cervical spine. 3. Mild to moderate multilevel cervical spine DDD, worse at C6-C7.   Electronically Signed   By: Simonne Come M.D.   On: 01/14/2015 13:27   Ct Cervical Spine Wo Contrast  01/14/2015   CLINICAL DATA:  Unwitnessed fall this morning.  History of dementia.  EXAM: CT HEAD WITHOUT CONTRAST  CT CERVICAL SPINE WITHOUT CONTRAST  TECHNIQUE: Multidetector CT imaging of the head and cervical spine was performed following the standard protocol without intravenous contrast. Multiplanar CT image reconstructions of the cervical spine were also generated.  COMPARISON:  01/01/2015 ; 01/09/2014  FINDINGS: CT HEAD FINDINGS  Similar findings of advanced atrophy with sulcal prominence and centralized volume loss with commensurate ex vacuo dilatation of the ventricular system. Grossly unchanged scattered periventricular hypodensities compatible microvascular ischemic disease. The gray-white differentiation is otherwise well maintained without CT evidence of acute large territory infarct. No intraparenchymal or extra-axial mass or hemorrhage.  Unchanged size and configuration of the ventricles and basilar cisterns. No midline shift. Intracranial atherosclerosis.  Limited visualization of the paranasal sinuses and mastoid air cells is normal. No air-fluid levels. Regional soft tissues appear normal. No displaced calvarial fracture.  CT CERVICAL SPINE FINDINGS  C1 to the superior endplate of T2 is imaged.  There is minimal (approximately 2 mm) of anterolisthesis of C3 upon C4 and C4 upon C5. The bilateral facets are normally aligned. The dens is normally positioned between the lateral masses of C1. Normal atlantodental and atlantoaxial articulations.  No fracture or static subluxation of the cervical spine. Cervical vertebral body heights are preserved. Prevertebral soft tissues are normal.  There is mild to moderate multilevel cervical spine DDD, worse at  C6-C7 with disc space height loss, endplate irregularity and small posteriorly directed disc osteophyte complex at this location.  Atherosclerotic plaque within the bilateral carotid bulbs.  Scattered shotty bilateral cervical lymph nodes are individually not enlarged by size criteria with index left-sided level IIA lymph node measuring 0.6 cm in greatest short axis diameter (image 47, series 6). Normal noncontrast appearance of the thyroid gland. Limited visualization of lung apices demonstrates mild interlobular septal thickening.  IMPRESSION: 1. Atrophy and microvascular ischemic disease without acute intracranial process. 2. No fracture or static subluxation of the cervical spine. 3. Mild to moderate multilevel cervical spine DDD, worse at C6-C7.   Electronically Signed   By: Simonne Come M.D.   On: 01/14/2015 13:27   Dg Chest Port 1 View  01/14/2015   CLINICAL DATA:  Fall.  Dementia.  EXAM: PORTABLE CHEST - 1 VIEW  COMPARISON:  None.  FINDINGS: There is diffuse interstitial coarsening with cephalized blood flow. No significant pleural effusion. Mild cardiomegaly. Mild aortic tortuosity for age, without evidence of enlargement.  IMPRESSION: Pulmonary edema.   Electronically Signed   By: Marnee Spring M.D.   On: 01/14/2015 10:19     PERTINENT LAB RESULTS: CBC:  Recent Labs  01/15/15 0310 01/15/15 0838  WBC 13.2* 15.1*  HGB 11.3* 12.3  HCT 35.6* 38.1  PLT 178 199   CMET CMP     Component Value Date/Time   NA 147* 01/15/2015 0310   K 2.5* 01/15/2015 0310   CL 106 01/15/2015 0310   CO2 31 01/15/2015 0310   GLUCOSE 129* 01/15/2015 0310   BUN 9 01/15/2015 0310   CREATININE 0.96 01/15/2015 0310   CALCIUM 8.1* 01/15/2015 0310   GFRNONAA 52* 01/15/2015 0310   GFRAA 61* 01/15/2015 0310    GFR Estimated Creatinine Clearance: 35.4 mL/min (by C-G formula based on Cr of 0.96). No results for input(s): LIPASE, AMYLASE in the last 72 hours. No results for input(s): CKTOTAL, CKMB,  CKMBINDEX, TROPONINI in the last 72 hours. Invalid input(s): POCBNP No results for input(s): DDIMER in the last 72 hours. No results for input(s): HGBA1C in the last 72 hours. No results for input(s): CHOL, HDL, LDLCALC, TRIG, CHOLHDL, LDLDIRECT in the last 72 hours. No results for input(s): TSH, T4TOTAL, T3FREE, THYROIDAB in the last 72 hours.  Invalid input(s): FREET3  Recent Labs  01/14/15 2150  RETICCTPCT 1.9   Coags:  Recent Labs  01/14/15 1033  INR 1.23   Microbiology: Recent Results (from the past 240 hour(s))  MRSA PCR Screening     Status: None   Collection Time: 01/14/15  8:14 PM  Result Value Ref Range Status   MRSA by PCR NEGATIVE NEGATIVE Final    Comment:  The GeneXpert MRSA Assay (FDA approved for NASAL specimens only), is one component of a comprehensive MRSA colonization surveillance program. It is not intended to diagnose MRSA infection nor to guide or monitor treatment for MRSA infections.      BRIEF HOSPITAL COURSE:  Severe sepsis: Foci not evident, UA equivocal for UTI. Chest x-ray negative. Admitted and started on empiric vancomycin and Zosyn. Cultures drawn on 2/12 pending so far. Continues to be very encephalopathic. Patient has had multiple admissions in the recent past, rapid decline in her functional status. This M.D. spoke with both son and daughter at bedside extensively on 2/13, family has decided to transition to full comfort status. Hence all antibiotics, IV fluids, lab work were discontinued. Comfort medications have been started. [facility]   Active Problems:  Acute encephalopathy: Likely metabolic encephalopathy secondary to above. CT head negative. Underlying history of advanced dementia.Since being transitioned to comfort care, no further workup recommended at this time. Supportive care with as needed Ativan, morphine and Haldol.  Atrial Fibrillation with RVR: Started on a  Cardizem infusion, continues to be tachycardic. I have discontinued Cardizem infusion, full comfort measures.   Acute diastolic heart failure: Likely precipitated from above. Given IV Lasix on admission. Since being transitioned to comfort care, have discontinued order for echocardiogram. Saline lock all fluids and provide supportive care.   History of pulmonary embolism/DVT: Patient apparently finished her Coumadin regimen on 2/11. No further anticoagulation needed at this point.   Ethics/palliative care: Long discussion with daughter and son at bedside on 2/13. Apparently patient has had a rapid decline in functional status since this past December. [facility]. Family aware that patient's overall prognosis is very poor, and that she has no quality of life at all. Given rapid decline in her functional status, repeated hospitalizations, advanced dementia with failure to thrive syndrome, she likely will deteriorate no matter what care we provided for her.Per Family, patient did not want any aggressive care, and family now agreeable to proceed with just comfort care status.She has been started on comfort medications, and plans are to [facility] is available.   TODAY-DAY OF DISCHARGE:  Subjective:   Mercedes SnowJeanette Pennington today is unresponsive. Daughter at bedside. No major events overnight  Objective:   Blood pressure 147/97, pulse 122, temperature 97.9 F (36.6 C), temperature source Axillary, resp. rate 18, height 5\' 3"  (1.6 m), weight 61 kg (134 lb 7.7 oz), SpO2 96 %.  Intake/Output Summary (Last 24 hours) at 01/16/15 1002 Last data filed at 01/16/15 0659  Gross per 24 hour  Intake      0 ml  Output    325 ml  Net   -325 ml   Filed Weights   01/14/15 1134 01/14/15 2015 01/15/15 0440  Weight: 63.504 kg (140 lb) 61.9 kg (136 lb 7.4 oz) 61 kg (134 lb 7.7 oz)    Exam Unresponsive, looks comfortable Chest:Clear to  ausculation HKV:[id number]:tachycardic EPP:IRJJAbd:soft and non tender  DISCHARGE CONDITION: Stable  [facility]ITION[facility]  DISCHARGE INSTRUCTIONS:    Activity:  As tolerated  Diet recommendation: Comfort feeds  CODE STATUS DNR  Discharge Instructions    Diet general    Complete by:  As directed   Comfort feeds as tolerated           Follow-up Information    Follow up with Margit HanksALEXANDER, ANNE D, MD.   Specialty:  Internal Medicine  Why:  As needed   Contact information:   [address] [place] [address] [phone]       Total Time spent on discharge equals 45 minutes.  SignedJeoffrey Massed 01/16/2015 10:02 AM

## 2015-01-16 NOTE — Progress Notes (Signed)
Patient discharged to [facility] as ordered.

## 2015-01-16 NOTE — Clinical Social Work Note (Signed)
CSW made aware patient ready for d/c to [facility]. CSW contacted [facility] and confirmed bed availability. CSW met with patient's daughter who was present at bedside. CSW introduced self and explained role. CSW discussed d/c plan with daughter. Daughter expressed concern with being able to ride with patient in ambulance to facility. CSW acknowledged daughter's concern and informed her that she is allowed to ride in the front with EMS. Patient's daughter expressed gratitude and states she just does not want to leave her side. Per daughter, patient has always been "the strong one," and it is difficult to watch her rapid health decline. CSW acknowledged daughter's determination to provide end of life support for patient. CSW prepared d/c packet for [facility] and faxed d/c summary to [place]. CSW placed packet in patient's shadow chart. CSW to arrange transportation via [place]. No further needs. CSW signing off.  [place], [place] Weekend Clinical Social Worker [phone]

## 2015-01-17 ENCOUNTER — Encounter: Payer: Self-pay | Admitting: Internal Medicine

## 2015-01-18 LAB — RESPIRATORY VIRUS PANEL
Adenovirus: NEGATIVE
INFLUENZA B 1: NEGATIVE
Influenza A: NEGATIVE
Metapneumovirus: NEGATIVE
Parainfluenza 1: NEGATIVE
Parainfluenza 2: NEGATIVE
Parainfluenza 3: NEGATIVE
RESPIRATORY SYNCYTIAL VIRUS A: NEGATIVE
Respiratory Syncytial Virus B: NEGATIVE
Rhinovirus: NEGATIVE

## 2015-01-20 LAB — CULTURE, BLOOD (ROUTINE X 2)
Culture: NO GROWTH
Culture: NO GROWTH

## 2015-02-01 DEATH — deceased

## 2015-08-28 IMAGING — CT CT HEAD W/O CM
2 series · 17 of 30 positions shown, 20 images · non-contrast
Comparison: 06/29/2014

CLINICAL DATA: 87-year-old female weakness and syncope.

EXAM:
CT HEAD WITHOUT CONTRAST
TECHNIQUE: Contiguous axial images were obtained from the base of the skull
through the vertex without intravenous contrast.

[Series 2: head w/o · axial · non-contrast · 0.45mm/px · z∈[-138,-18]mm · 9 of 30 slices shown, 12 images]
[im 3/30  brain]
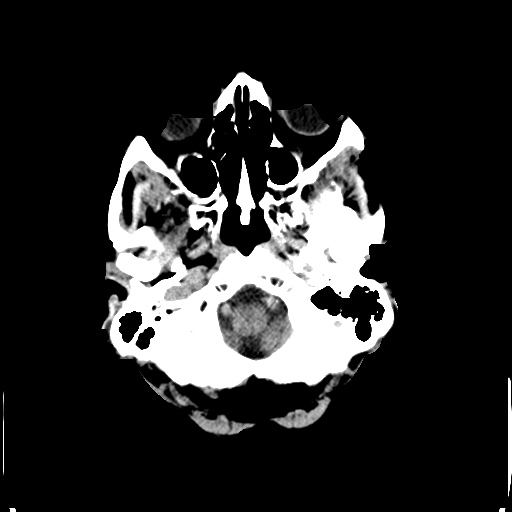
[im 3/30  bone]
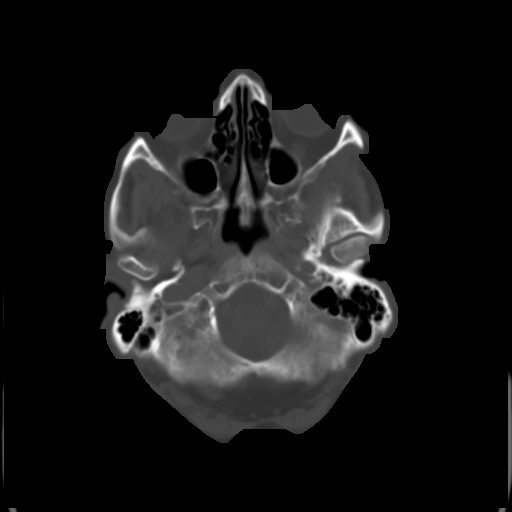
[im 6/30  brain]
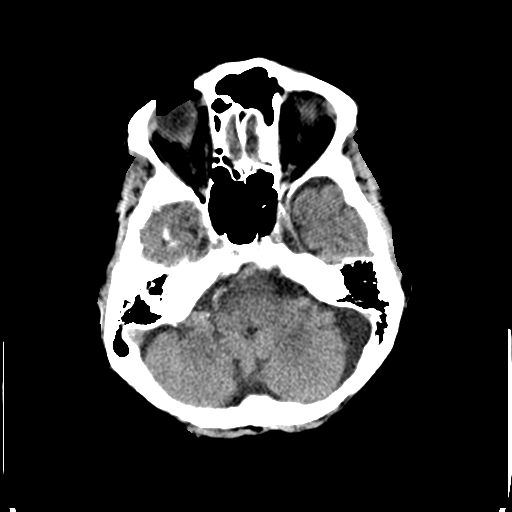
[im 9/30  brain]
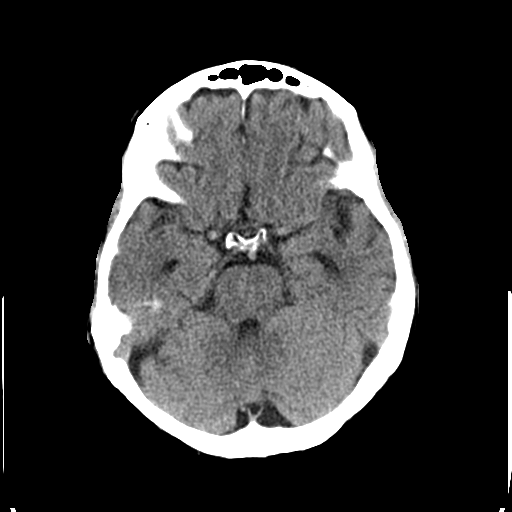
[im 12/30  brain]
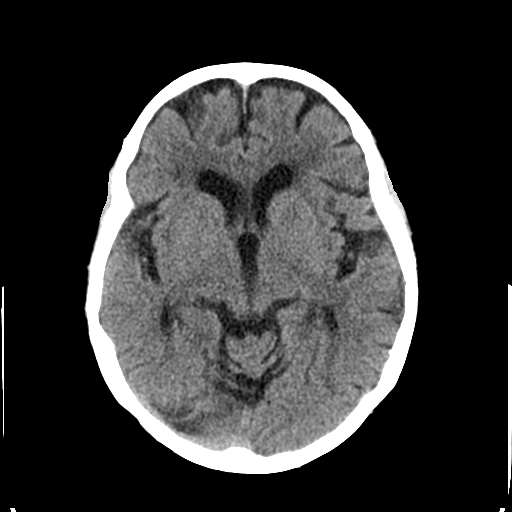
[im 15/30  brain]
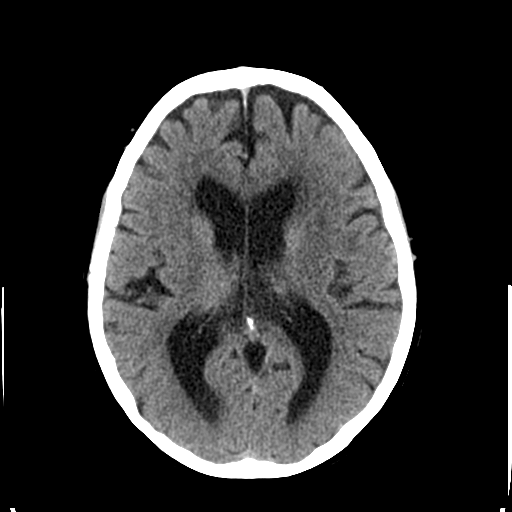
[im 15/30  bone]
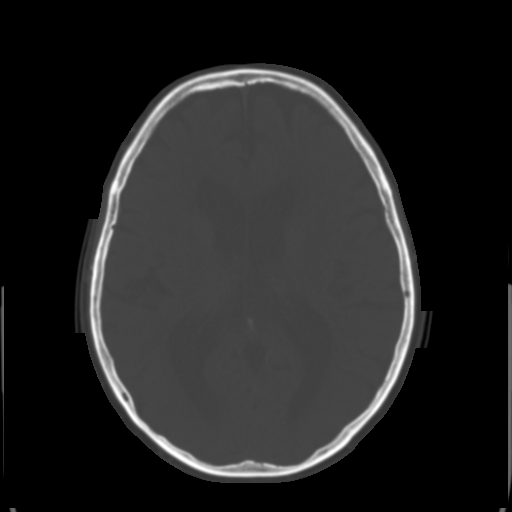
[im 18/30  brain]
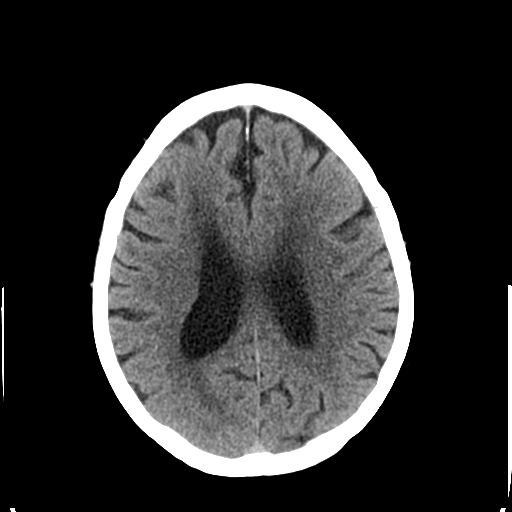
[im 21/30  brain]
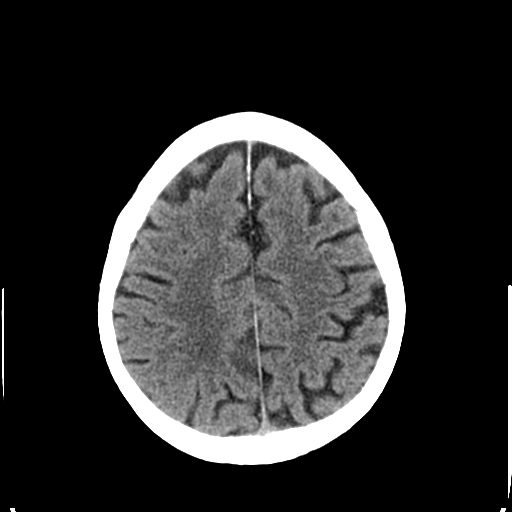
[im 24/30  brain]
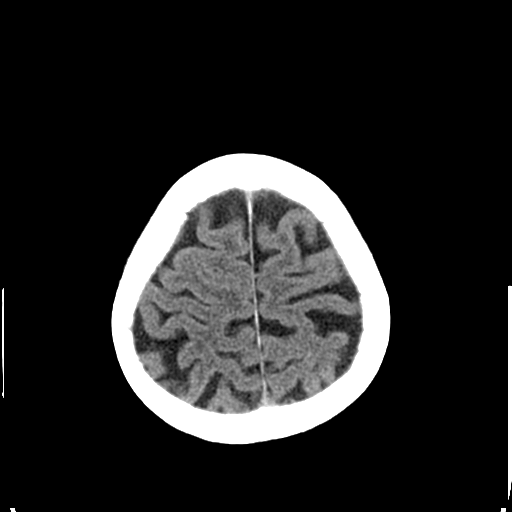
[im 27/30  brain]
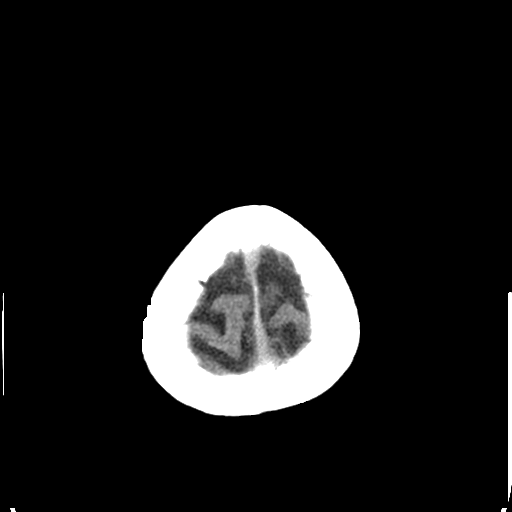
[im 27/30  bone]
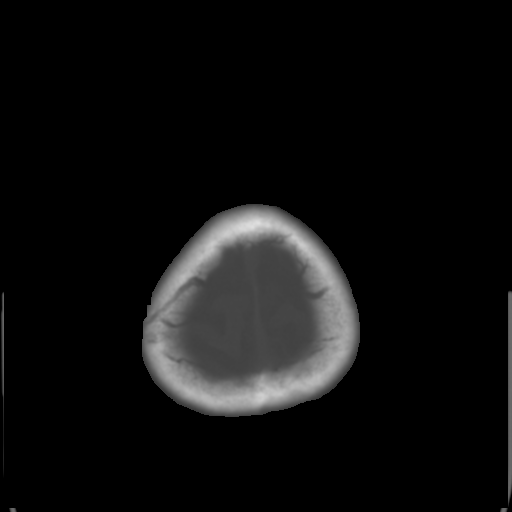

[Series 3: bone windows · axial · 0.45mm/px · z∈[-133,-22]mm · 8 of 49 slices shown]
[im 6/49  bone]
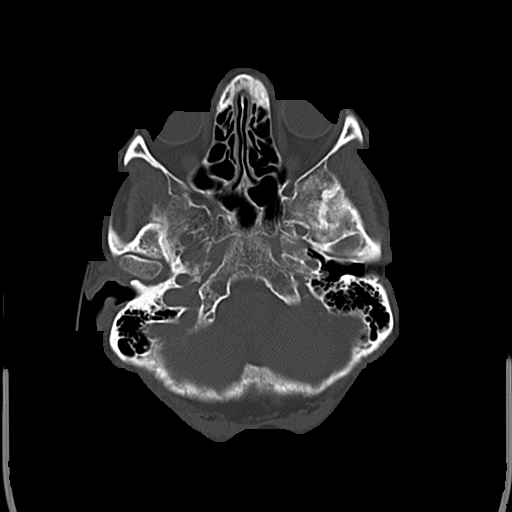
[im 11/49  bone]
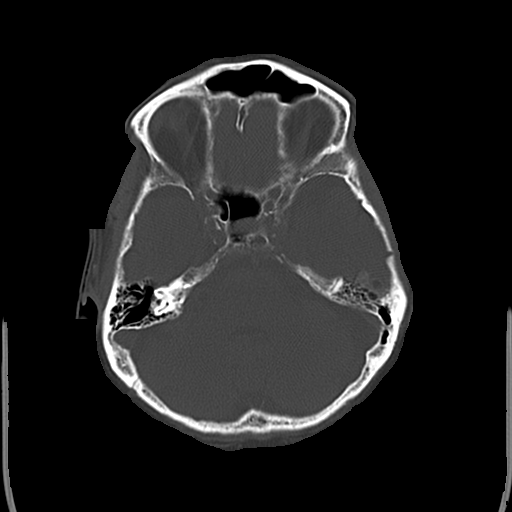
[im 17/49  bone]
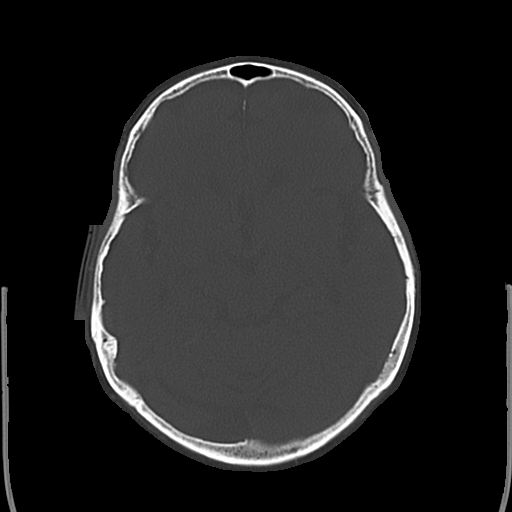
[im 22/49  bone]
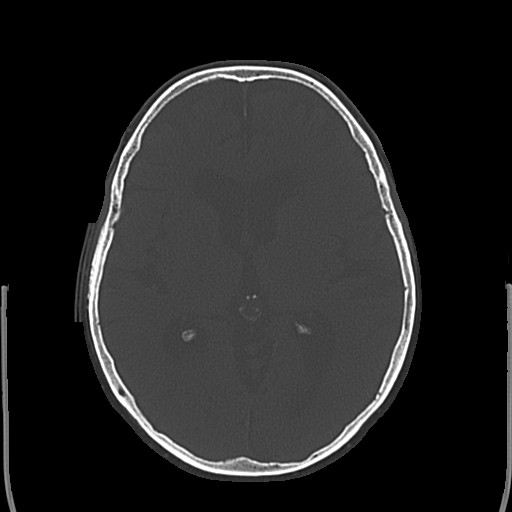
[im 27/49  bone]
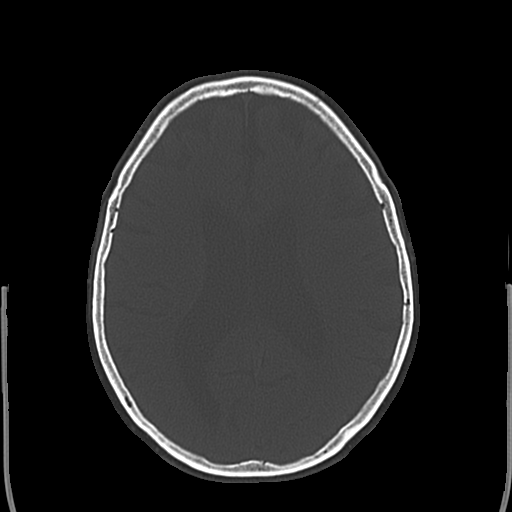
[im 33/49  bone]
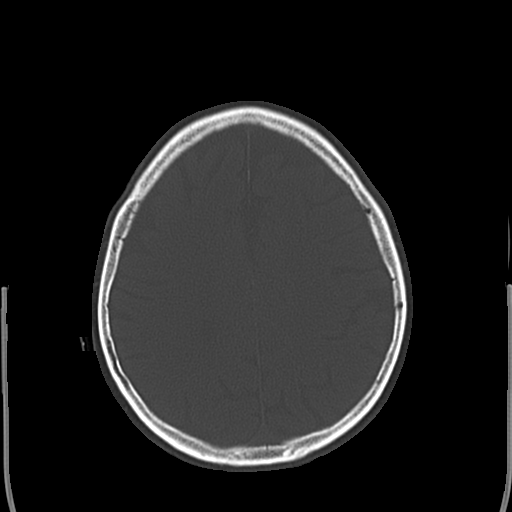
[im 38/49  bone]
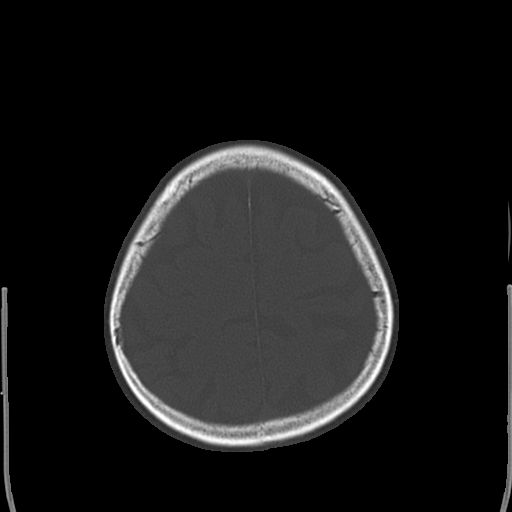
[im 43/49  bone]
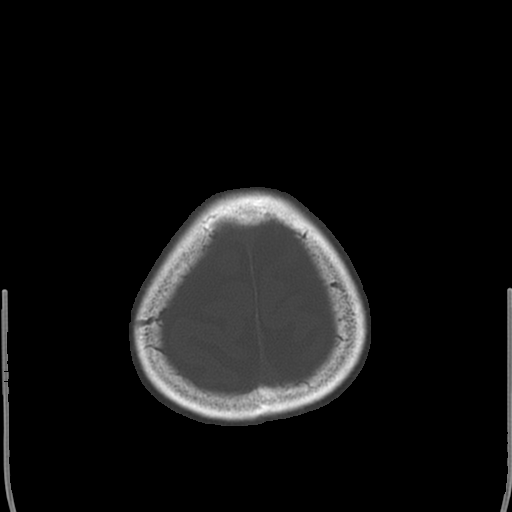

[17 of 30 positions shown; findings below may reference images not displayed]

FINDINGS: Mild atrophy and chronic small-vessel white matter ischemic changes
again noted.

No acute intracranial abnormalities are identified, including mass
lesion or mass effect, hydrocephalus, extra-axial fluid collection,
midline shift, hemorrhage, or acute infarction.

The visualized bony calvarium is unremarkable.
IMPRESSION: No evidence of acute intracranial abnormality.

Atrophy and chronic small-vessel white matter ischemic changes.

## 2015-08-28 IMAGING — CR DG CHEST 1V PORT
1 series · 1 of 1 positions shown · non-contrast
Comparison: 06/29/2014

CLINICAL DATA: Weakness.

EXAM:
PORTABLE CHEST - 1 VIEW

[AP]
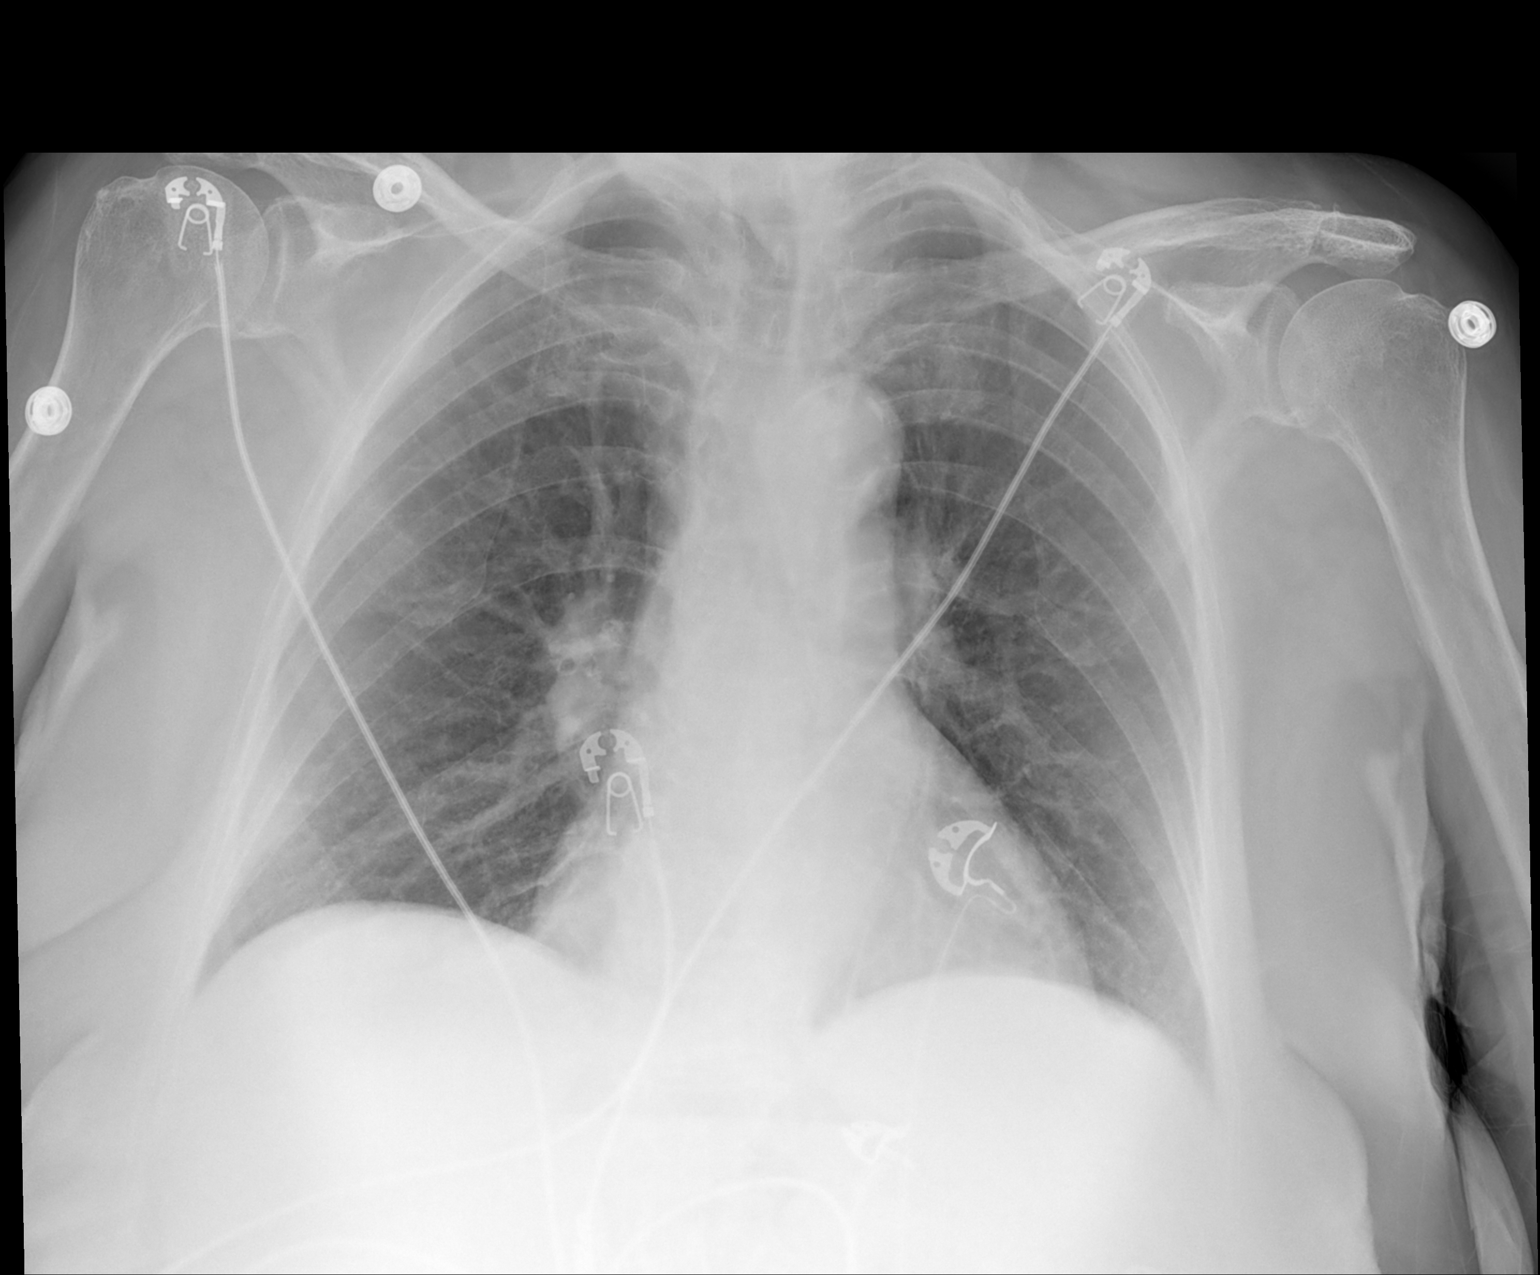

[1 of 1 positions shown; findings below may reference images not displayed]

FINDINGS: The heart size and mediastinal contours are within normal limits.
There is no evidence of pulmonary edema, consolidation,
pneumothorax, nodule or pleural fluid. The visualized skeletal
structures are unremarkable.
IMPRESSION: No active disease.

## 2016-02-18 IMAGING — CR DG KNEE COMPLETE 4+V*L*
4 series · 4 of 4 positions shown · non-contrast
Comparison: None.

CLINICAL DATA: found on floor at [HOSPITAL] crawling on floor.
Unknown if pt fell. Pain and abrasions on anterior left knee

EXAM:
LEFT KNEE - COMPLETE 4+ VIEW

[x knee ap left]
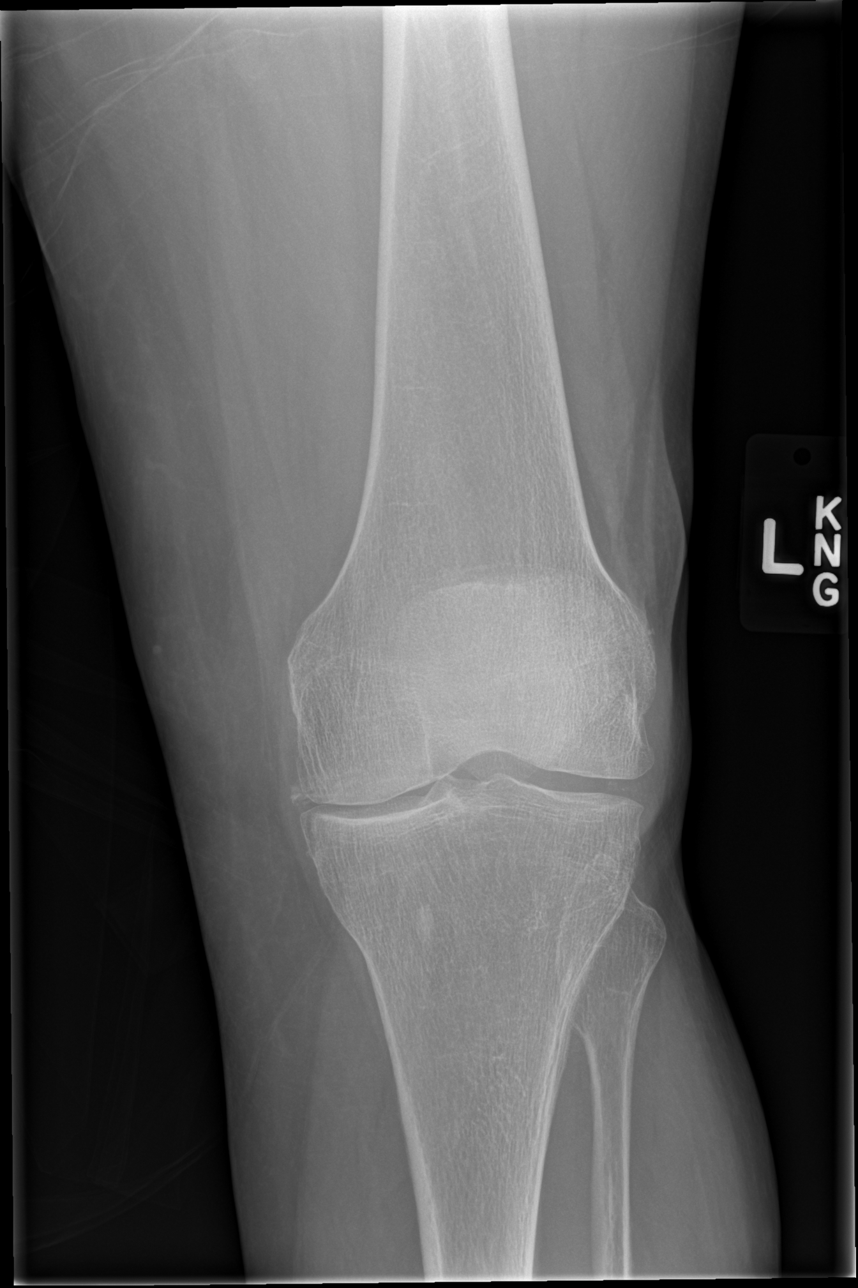

[x knee obl left (1 of 2)]
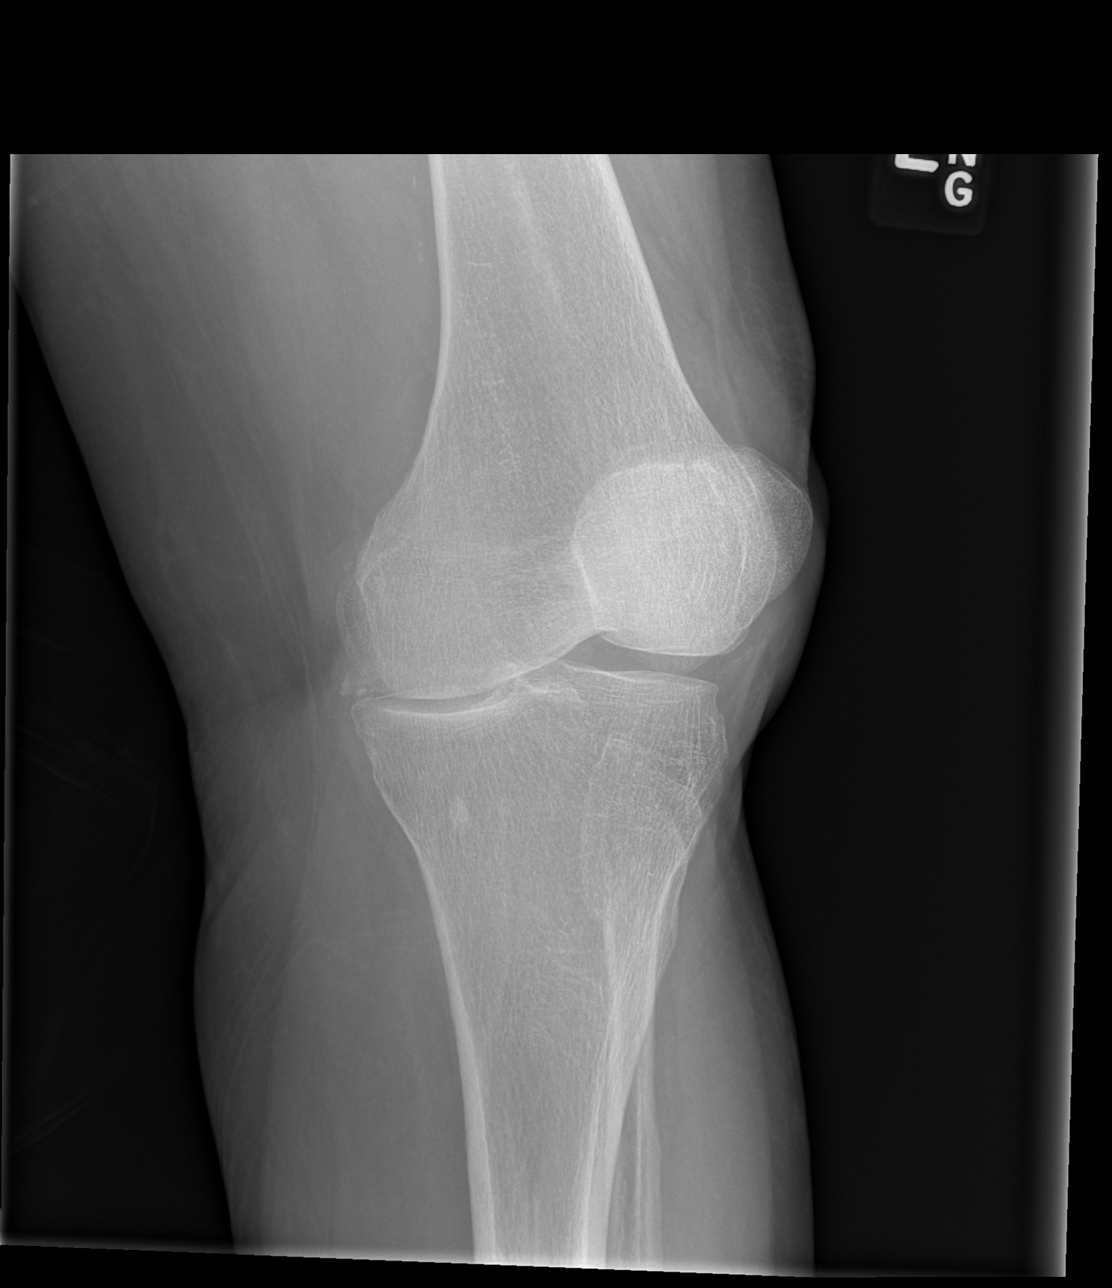

[x knee obl left (2 of 2)]
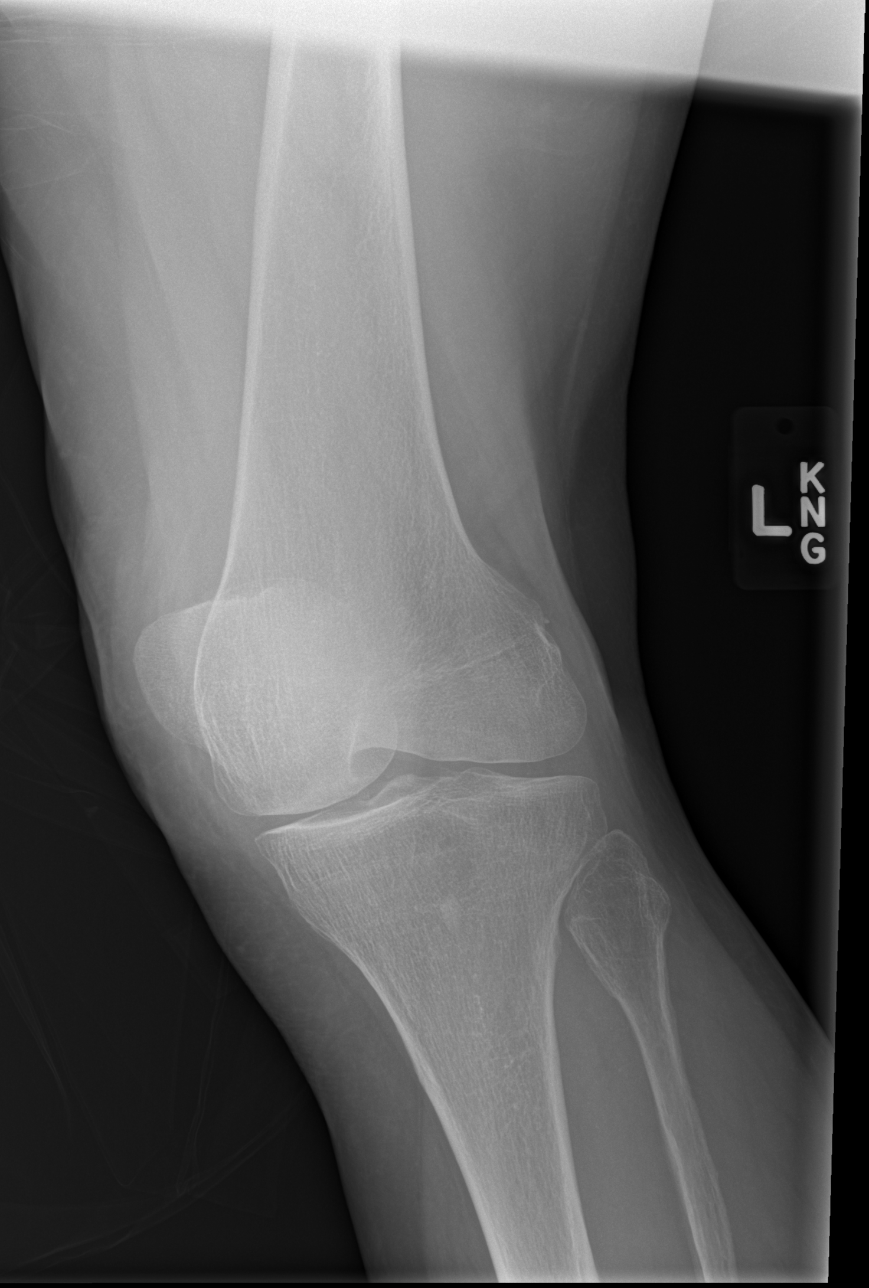

[x knee lat left]
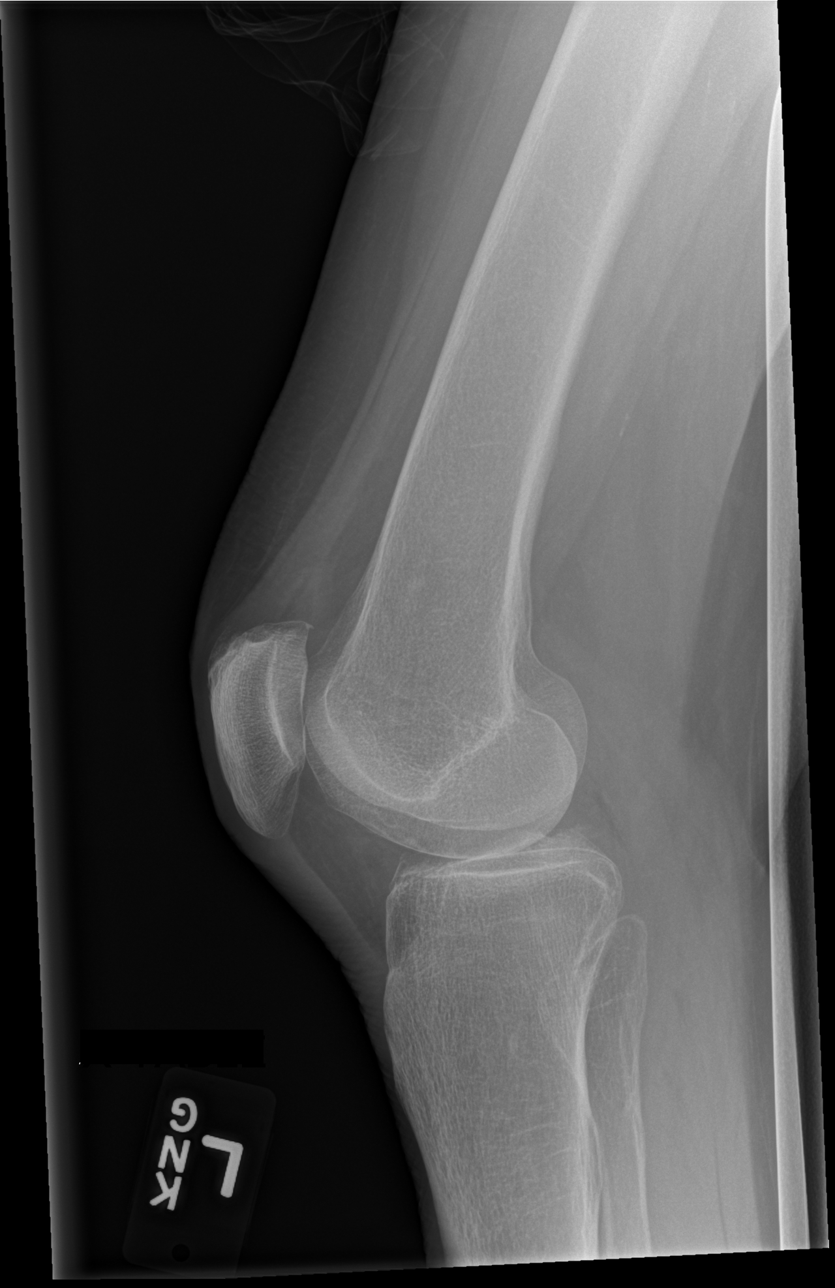

[4 of 4 positions shown; findings below may reference images not displayed]

FINDINGS: Narrowing of the articular cartilage in the medial compartment with
small marginal spurs. Chondrocalcinosis in medial and lateral
compartments. Small marginal spur from the patellar articular
surface. Negative for fracture or dislocation. No effusion. Mild
diffuse osteopenia.
IMPRESSION: 1. Negative for fracture or other acute bony injury.
2. Degenerative changes most marked in the medial compartment, with
chondrocalcinosis suggesting CPPD.
